# Patient Record
Sex: Male | Born: 1975 | Race: Black or African American | Hispanic: No | Marital: Married | Smoking: Never smoker
Health system: Southern US, Community
[De-identification: ages and names within clinical notes are randomized; demographics above are authoritative.]

## PROBLEM LIST (undated history)

## (undated) DIAGNOSIS — E785 Hyperlipidemia, unspecified: Secondary | ICD-10-CM

## (undated) DIAGNOSIS — I10 Essential (primary) hypertension: Secondary | ICD-10-CM

## (undated) DIAGNOSIS — T7840XA Allergy, unspecified, initial encounter: Secondary | ICD-10-CM

## (undated) DIAGNOSIS — K635 Polyp of colon: Secondary | ICD-10-CM

## (undated) DIAGNOSIS — K5792 Diverticulitis of intestine, part unspecified, without perforation or abscess without bleeding: Secondary | ICD-10-CM

## (undated) HISTORY — PX: VASECTOMY: SHX75

## (undated) HISTORY — DX: Diverticulitis of intestine, part unspecified, without perforation or abscess without bleeding: K57.92

## (undated) HISTORY — DX: Hyperlipidemia, unspecified: E78.5

## (undated) HISTORY — PX: FINGER SURGERY: SHX640

## (undated) HISTORY — PX: COLONOSCOPY W/ BIOPSIES: SHX1374

## (undated) HISTORY — DX: Essential (primary) hypertension: I10

## (undated) HISTORY — DX: Allergy, unspecified, initial encounter: T78.40XA

## (undated) HISTORY — DX: Polyp of colon: K63.5

---

## 2002-06-07 ENCOUNTER — Emergency Department (HOSPITAL_COMMUNITY): Admission: EM | Admit: 2002-06-07 | Discharge: 2002-06-07 | Payer: Self-pay | Admitting: Emergency Medicine

## 2002-06-07 ENCOUNTER — Encounter: Payer: Self-pay | Admitting: Emergency Medicine

## 2005-10-27 ENCOUNTER — Inpatient Hospital Stay (HOSPITAL_COMMUNITY): Admission: EM | Admit: 2005-10-27 | Discharge: 2005-10-31 | Payer: Self-pay | Admitting: Emergency Medicine

## 2005-12-21 ENCOUNTER — Encounter: Admission: RE | Admit: 2005-12-21 | Discharge: 2005-12-21 | Payer: Self-pay | Admitting: General Surgery

## 2005-12-25 ENCOUNTER — Ambulatory Visit: Payer: Self-pay | Admitting: Internal Medicine

## 2006-01-05 ENCOUNTER — Ambulatory Visit: Payer: Self-pay | Admitting: Internal Medicine

## 2006-10-18 DIAGNOSIS — K5792 Diverticulitis of intestine, part unspecified, without perforation or abscess without bleeding: Secondary | ICD-10-CM

## 2006-10-18 HISTORY — DX: Diverticulitis of intestine, part unspecified, without perforation or abscess without bleeding: K57.92

## 2010-06-27 ENCOUNTER — Inpatient Hospital Stay (INDEPENDENT_AMBULATORY_CARE_PROVIDER_SITE_OTHER)
Admission: RE | Admit: 2010-06-27 | Discharge: 2010-06-27 | Disposition: A | Discharge status: Home / Self Care | Payer: BC Managed Care – PPO | Source: Ambulatory Visit | Attending: Family Medicine | Admitting: Family Medicine

## 2010-06-27 ENCOUNTER — Emergency Department (HOSPITAL_COMMUNITY)
Admission: EM | Admit: 2010-06-27 | Discharge: 2010-06-27 | Disposition: A | Discharge status: Home / Self Care | Payer: BC Managed Care – PPO | Attending: Emergency Medicine | Admitting: Emergency Medicine

## 2010-06-27 ENCOUNTER — Emergency Department (HOSPITAL_COMMUNITY): Payer: BC Managed Care – PPO

## 2010-06-27 DIAGNOSIS — R000 Tachycardia, unspecified: Secondary | ICD-10-CM

## 2010-06-27 DIAGNOSIS — R079 Chest pain, unspecified: Secondary | ICD-10-CM

## 2010-06-27 DIAGNOSIS — J45909 Unspecified asthma, uncomplicated: Secondary | ICD-10-CM | POA: Insufficient documentation

## 2010-06-27 DIAGNOSIS — R002 Palpitations: Secondary | ICD-10-CM | POA: Insufficient documentation

## 2010-06-27 LAB — POCT CARDIAC MARKERS
CKMB, poc: 1.1 ng/mL (ref 1.0–8.0)
Myoglobin, poc: 88 ng/mL (ref 12–200)

## 2010-06-27 LAB — CBC
HCT: 46.4 % (ref 39.0–52.0)
Hemoglobin: 16.1 g/dL (ref 13.0–17.0)
MCHC: 34.7 g/dL (ref 30.0–36.0)
RBC: 5.23 MIL/uL (ref 4.22–5.81)

## 2010-06-27 LAB — BASIC METABOLIC PANEL
BUN: 13 mg/dL (ref 6–23)
Calcium: 10.6 mg/dL — ABNORMAL HIGH (ref 8.4–10.5)
GFR calc non Af Amer: >60 mL/min (ref >60)
Glucose, Bld: 94 mg/dL (ref 70–99)
Sodium: 138 mEq/L (ref 135–145)

## 2010-06-27 LAB — DIFFERENTIAL
Basophils Absolute: 0 10*3/uL (ref 0.0–0.1)
Lymphocytes Relative: 31 % (ref 12–46)
Monocytes Absolute: 0.4 10*3/uL (ref 0.1–1.0)
Monocytes Relative: 7 % (ref 3–12)
Neutro Abs: 3.8 10*3/uL (ref 1.7–7.7)

## 2010-06-30 ENCOUNTER — Ambulatory Visit (INDEPENDENT_AMBULATORY_CARE_PROVIDER_SITE_OTHER): Payer: BC Managed Care – PPO | Admitting: Medical

## 2010-06-30 ENCOUNTER — Encounter: Payer: Self-pay | Admitting: Medical

## 2010-06-30 VITALS — BP 171/109 | HR 110 | Ht 68.0 in | Wt 188.0 lb

## 2010-06-30 DIAGNOSIS — E785 Hyperlipidemia, unspecified: Secondary | ICD-10-CM

## 2010-06-30 DIAGNOSIS — I10 Essential (primary) hypertension: Secondary | ICD-10-CM

## 2010-06-30 DIAGNOSIS — R002 Palpitations: Secondary | ICD-10-CM

## 2010-06-30 MED ORDER — ATENOLOL 25 MG PO TABS: 25.0000 mg | ORAL_TABLET | Freq: Every day | ORAL | Status: DC

## 2010-06-30 NOTE — Patient Instructions (Signed)

## 2010-06-30 NOTE — Progress Notes (Signed)
Subjective:   HPI Derrick Garcia is a new patient here today, here for follow up from emergency department visit on 06/27/2010 for complaint of palpitations and chest pain after receiving some bad news. He initially showed up in the urgent care and was sent to the emergency department. EKG, labs, cardiac enzymes were done. He was advised to follow up with a primary care provider for chest pain of unknown etiology. Since his emergency department visit, he has cut down alcohol intake, and realizes the need to get his health in better shape including exercise and better diet. He notes a history of high cholesterol 2 years ago but no prior medication. He had high blood pressure a few years ago but was trying lifestyle changes to get under control instead of medication. No other new complaint.  Past Medical History  Diagnosis Date  . Asthma   . Diverticulitis September 2008    Hospitalization  . Hyperlipidemia   . Hypertension   . Allergy     Review of Systems Constitutional: denies fever, chills, sweats, unexpected weight change, anorexia, fatigue Cardiology: Positive for palpitations, intermittent several times per month, occasional mild chest pain that is brief; denies edema, orthopnea, paroxysmal nocturnal dyspnea Respiratory: denies cough, shortness of breath, dyspnea on exertion, wheezing, hemoptysis Gastroenterology: denies abdominal pain, nausea, vomiting, diarrhea, constipation Musculoskeletal: denies arthralgias, myalgias, back pain Ophthalmology: denies vision changes Urology: denies dysuria, difficulty urinating, hematuria, urinary frequency Neurology: no headache, weakness, tingling, numbness, speech abnormality, memory loss, falls, dizziness Psychology: denies depressed mood, agitation, sleep problems   Objective:   Filed Vitals:   06/30/10 1352  BP: 171/109  Pulse: 110    General appearance: alert, no distress, WD/WN, African American male , looks stated age HEENT:  normocephalic, conjunctiva/corneas normal, sclerae anicteric, PERRLA, EOMi, nares patent Oral cavity: MMM, tongue normal, teeth normal Neck: supple, no lymphadenopathy, no thyromegaly, no masses, no bruit  Heart: RRR, normal S1, S2, no murmurs Lungs: CTA bilaterally, no wheezes, rhonchi, or rales Abdomen: +bs, soft, non tender, non distended, no masses, no hepatomegaly, no splenomegaly, no bruits Extremities: no edema, no cyanosis, no clubbing Pulses: 2+ symmetric, upper and lower extremities, normal cap refill Neurological: alert, oriented x 3, CN2-12 intact, strength normal, sensation normal throughout, DTRs 2+ throughout, no cerebellar signs, gait normal Psychiatric: normal affect, behavior normal, pleasant      Assessment & Plan:    Encounter Diagnoses  Name Primary?  . Hypertension Yes  . Palpitations   . Hyperlipidemia     I reviewed his emergency department note from 06/27/10, his cardiac markers are normal, blood count normal, basic metabolic panel, chest x-ray was normal per report, EKG notation was normal although I do not have a copy of the EKG. His blood pressure was listed as 167/100 in the emergency department. We discussed the risk of uncontrolled hypertension, side effects of medication. We will start him on atenolol 25 mg daily for both palpitations and hypertension, and advised he'll probably be on medication lifelong. We'll refer him to cardiology for further evaluation with possible Holter monitor given the recommendation by the emergency department. He'll return this week for fasting labs. Advised he limit his alcohol intake, begin walking gradually increase his exercise but no weight lifting for now. Discussed healthier diet. Recheck in 1 month

## 2010-07-01 ENCOUNTER — Encounter: Payer: Self-pay | Admitting: Medical

## 2010-07-01 ENCOUNTER — Other Ambulatory Visit: Payer: BC Managed Care – PPO

## 2010-07-01 LAB — TSH: TSH: 1.859 u[IU]/mL (ref 0.350–4.500)

## 2010-07-01 LAB — ALT: ALT: 26 U/L (ref 0–53)

## 2010-07-02 ENCOUNTER — Telehealth: Payer: Self-pay | Admitting: *Deleted

## 2010-07-02 NOTE — Telephone Encounter (Addendum)
Message copied by Ellsworth Lennox on Wed Jul 02, 2010  8:37 AM ------      Message from: Aleen Campi, DAVID      Created: Wed Jul 02, 2010  8:28 AM       His thyroid lab came back normal, liver test normal, but his lipids are too high.  His fats/triglycerides were 394, and they should be under 150.   His total cholesterol 233.  I suspect a lot of this is diet related.  Thus, as we discussed, he needs to work on improving his diet, aerobic exercise such as walking, and eat a low fat, low cholesterol diet (less red meat, less cheese, less ice cream, less fast/fatty foods).  Lets plan to recheck his cholesterol and fats again in 59mo. Hopefully he has begun the Atenolol BP med, and c/t with plan to see cardiology per our referral.   Called to inform patient of lab results.  Patient has started BP meds.  Patient stated " I do eat a lot of red meat."  Advised patient to decrease red meat in diet because of triglycerides and cholesterol being high.  Patient agreed.  Patient scheduled an appointment on October 02, 2010 at 8:30 for a follow up on BP med and cholesterol/triglycerides.  CM, LPN

## 2010-09-02 ENCOUNTER — Telehealth: Payer: Self-pay | Admitting: *Deleted

## 2010-09-02 NOTE — Telephone Encounter (Signed)
pls pull his chart 

## 2010-09-09 ENCOUNTER — Other Ambulatory Visit: Payer: Self-pay | Admitting: Medical

## 2010-09-09 DIAGNOSIS — I10 Essential (primary) hypertension: Secondary | ICD-10-CM

## 2010-09-09 MED ORDER — ATENOLOL 25 MG PO TABS: 25.0000 mg | ORAL_TABLET | Freq: Every day | ORAL | Status: DC

## 2010-09-29 ENCOUNTER — Ambulatory Visit: Payer: BC Managed Care – PPO | Admitting: Medical

## 2010-09-30 ENCOUNTER — Ambulatory Visit (INDEPENDENT_AMBULATORY_CARE_PROVIDER_SITE_OTHER): Payer: BC Managed Care – PPO | Admitting: Medical

## 2010-09-30 ENCOUNTER — Encounter: Payer: Self-pay | Admitting: Medical

## 2010-09-30 VITALS — BP 130/90 | HR 80 | Temp 98.2°F | Resp 16 | Ht 68.0 in | Wt 179.0 lb

## 2010-09-30 DIAGNOSIS — I10 Essential (primary) hypertension: Secondary | ICD-10-CM

## 2010-09-30 DIAGNOSIS — E781 Pure hyperglyceridemia: Secondary | ICD-10-CM

## 2010-09-30 NOTE — Progress Notes (Signed)
Subjective:   HPI Derrick Garcia is a 35 y.o. male who presents for recheck.  I saw him as a new patient back in May for blood pressure concerns. He had just had an emergency dept visit for chest pain preceding his visit here.  I have referred him to cardiology for further evaluation.  At that time we had done some lab work showing elevated triglycerides and lipids.  He had also been drinking alcohol daily.  However he has made some big efforts to get his health in shape.  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 He has lost about 10 pounds since his last visit here. His blood pressure is under much better control. When he saw cardiology, he apparently had a normal sleep study, and a normal echocardiogram. He sees cardiology in 3 months for follow up. He also apparently just had a new baby boy born this week. So he now has 3 boys. No other c/o.  The following portions of the patient's history were reviewed and updated as appropriate: allergies, current medications, past family history, past medical history, past social history, past surgical history and problem list.  Past Medical History  Diagnosis Date  . Asthma   . Diverticulitis September 2008    Hospitalization  . Hyperlipidemia   . Hypertension   . Allergy     Review of Systems Gen: No fevers, chills, sweats Heart: No chest pain, palpitations, edema Lungs: No shortness of breath, cough Neuro: No headache, numbness, tingling Abdomen: No abdominal pain, nausea, vomiting, diarrhea    Objective:   Physical Exam  General appearance: alert, no distress, WD/WN, black male Heart: RRR, no murmur, normal S1, S2 Ext: no edema Pulses: 2+    Assessment :    Encounter Diagnoses  Name Primary?  . Essential hypertension, benign Yes  . Hypertriglyceridemia      Plan:     HTN - I reviewed his cardiology notes from July. His  blood pressure medications were increased at that time. His blood pressure is much better than it was when I first met him in May. His echocardiogram was normal, sleep study normal.  He will followup with cardiology in 3 months as planned  Hypertriglyceridemia- although his labs were elevated recently, he has since cut out a lot of alcohol,is exercising, has lost some weight. He has taken steps to improve his health care. We will hold off on labs today. he'll continue with lifestyle changes, diet and exercise goals, and we will see him back in 4-6 months.

## 2010-10-02 ENCOUNTER — Ambulatory Visit: Payer: BC Managed Care – PPO | Admitting: Medical

## 2011-01-18 ENCOUNTER — Encounter (HOSPITAL_COMMUNITY): Payer: Self-pay

## 2011-01-18 ENCOUNTER — Emergency Department (INDEPENDENT_AMBULATORY_CARE_PROVIDER_SITE_OTHER)
Admission: EM | Admit: 2011-01-18 | Discharge: 2011-01-18 | Disposition: A | Discharge status: Home / Self Care | Payer: BC Managed Care – PPO | Source: Home / Self Care | Attending: Emergency Medicine | Admitting: Emergency Medicine

## 2011-01-18 DIAGNOSIS — R6889 Other general symptoms and signs: Secondary | ICD-10-CM

## 2011-01-18 MED ORDER — IBUPROFEN 600 MG PO TABS: 600.0000 mg | ORAL_TABLET | Freq: Four times a day (QID) | ORAL | Status: AC | PRN

## 2011-01-18 MED ORDER — ALBUTEROL SULFATE HFA 108 (90 BASE) MCG/ACT IN AERS: 1.0000 | INHALATION_SPRAY | Freq: Four times a day (QID) | RESPIRATORY_TRACT | Status: DC | PRN

## 2011-01-18 MED ORDER — HYDROCODONE-HOMATROPINE 5-1.5 MG/5ML PO SYRP: 5.0000 mL | ORAL_SOLUTION | Freq: Four times a day (QID) | ORAL | Status: AC | PRN

## 2011-01-18 NOTE — ED Provider Notes (Signed)
History     CSN: [id number] Arrival date & time: 01/18/2011  9:12 AM   First MD Initiated Contact with Patient 01/18/11 [phone]      Chief Complaint  Patient presents with  . Influenza    Pt has cough, fever and bodyaches since yesterday    HPI Comments: Pt with rhinorrhea, postnasal drip, ST, nonproductive cough, fatigue. bodyaches starting yesterday. Fevers tmax 100.3.  No ear pain, abd pain, wheeze, SOB, abd pain, rash, N/V. Slightly decreased appetite but is tolerating po. Unable to sleep at night secondary to coughing.       Patient is a 35 y.o. male presenting with flu symptoms. The history is provided by the patient.  Influenza This is a new problem. The current episode started yesterday. Pertinent negatives include no chest pain, no abdominal pain, no headaches and no shortness of breath. The symptoms are aggravated by nothing. He has tried nothing for the symptoms.    Past Medical History  Diagnosis Date  . Asthma   . Diverticulitis September 2008    Hospitalization  . Hyperlipidemia   . Hypertension   . Allergy     History reviewed. No pertinent past surgical history.  Family History  Problem Relation Age of Onset  . Hypertension Mother   . Hypertension Father   . Heart disease Brother     Data as a child from a hole in the heart    History  Substance Use Topics  . Smoking status: Never Smoker   . Smokeless tobacco: Never Used  . Alcohol Use: 13.2 oz/week    22 Cans of beer per week      Review of Systems  Constitutional: Positive for fever, chills and fatigue.  HENT: Positive for congestion and sore throat. Negative for ear pain, trouble swallowing and voice change.   Respiratory: Positive for cough. Negative for chest tightness, shortness of breath and wheezing.   Cardiovascular: Negative for chest pain.  Gastrointestinal: Negative for nausea, vomiting, abdominal pain and diarrhea.  Skin: Negative for rash.  Neurological: Negative for headaches.      Allergies  Ciprocin-fluocin-procin and Penicillins  Home Medications   Current Outpatient Rx  Name Route Sig Dispense Refill  . ALBUTEROL SULFATE HFA 108 (90 BASE) MCG/ACT IN AERS Inhalation Inhale 1-2 puffs into the lungs every 6 (six) hours as needed for wheezing. 1 Inhaler 0  . ATENOLOL 25 MG PO TABS Oral Take 1 tablet (25 mg total) by mouth daily. 90 tablet 2  . HYDROCODONE-HOMATROPINE 5-1.5 MG/5ML PO SYRP Oral Take 5 mLs by mouth every 6 (six) hours as needed for cough or pain. 120 mL 0  . IBUPROFEN 600 MG PO TABS Oral Take 1 tablet (600 mg total) by mouth every 6 (six) hours as needed for pain. 30 tablet 0  . LOSARTAN POTASSIUM 100 MG PO TABS Oral Take 100 mg by mouth daily.        BP 147/89  Pulse 93  Temp(Src) 100.3 F (37.9 C) (Oral)  Resp 16  SpO2 99%  Physical Exam  Nursing note and vitals reviewed. Constitutional: He is oriented to person, place, and time. He appears well-developed and well-nourished.  HENT:  Head: Normocephalic and atraumatic.  Right Ear: Hearing, tympanic membrane and ear canal normal.  Left Ear: Hearing, tympanic membrane and ear canal normal.  Nose: Mucosal edema and rhinorrhea present. No epistaxis.  Mouth/Throat: Uvula is midline and mucous membranes are normal. Posterior oropharyngeal erythema present. No oropharyngeal exudate.  No sinus tenderness  Eyes: Conjunctivae and EOM are normal. Pupils are equal, round, and reactive to light.  Neck: Normal range of motion. Neck supple.  Cardiovascular: Normal rate, regular rhythm and normal heart sounds.   Pulmonary/Chest: Effort normal and breath sounds normal. No respiratory distress.       occ scattered wheezing  Abdominal: Soft. Bowel sounds are normal. He exhibits no distension. There is no tenderness.  Musculoskeletal: Normal range of motion. He exhibits no edema and no tenderness.  Lymphadenopathy:    He has no cervical adenopathy.  Neurological: He is alert and oriented to person,  place, and time.  Skin: Skin is warm and dry. No rash noted.  Psychiatric: He has a normal mood and affect. His behavior is normal. Judgment and thought content normal.    ED Course  Procedures (including critical care time)  Labs Reviewed - No data to display No results found.   1. Flu-like symptoms       MDM  Pt appears to be in NAD. VSS. Pt non-toxic appearing. No evidence of pharyngitis or OM. No evidence of neck stiffness or other sx to support meningitis. No evidence of dehydration. Abd S/NT/ND without peritoneal sx. Doubt intraabdominal process. No evidence of PNA. Pt able to tolerate PO. Pt with viral syndrome, probable flu. Has h/o asthma, no albuterol at home will have him start this prn cough as well. Will treat symptomatically and have pt f/u with PCP PRN   Luiz Blare, MD 01/18/11 [phone]

## 2011-04-25 ENCOUNTER — Encounter (HOSPITAL_COMMUNITY): Payer: Self-pay | Admitting: Emergency Medicine

## 2011-04-25 ENCOUNTER — Emergency Department (INDEPENDENT_AMBULATORY_CARE_PROVIDER_SITE_OTHER)
Admission: EM | Admit: 2011-04-25 | Discharge: 2011-04-25 | Disposition: A | Discharge status: Home / Self Care | Payer: BC Managed Care – PPO | Source: Home / Self Care | Attending: Emergency Medicine | Admitting: Emergency Medicine

## 2011-04-25 ENCOUNTER — Emergency Department (INDEPENDENT_AMBULATORY_CARE_PROVIDER_SITE_OTHER): Payer: BC Managed Care – PPO

## 2011-04-25 DIAGNOSIS — S62619A Displaced fracture of proximal phalanx of unspecified finger, initial encounter for closed fracture: Secondary | ICD-10-CM

## 2011-04-25 DIAGNOSIS — IMO0002 Reserved for concepts with insufficient information to code with codable children: Secondary | ICD-10-CM

## 2011-04-25 MED ORDER — OXYCODONE-ACETAMINOPHEN 5-325 MG PO TABS: ORAL_TABLET | ORAL | Status: AC

## 2011-04-25 NOTE — ED Provider Notes (Signed)
Chief Complaint  Patient presents with  . Finger Injury    History of Present Illness:   [name] is a 36 year old male who injured his right little finger playing football with his son today. The finger is some deformity. It's painful, and he cannot flex or extend. No numbness or tingling.  Review of Systems:  Other than noted above, the patient denies any of the following symptoms: Systemic:  No fevers, chills, sweats, or aches.  No fatigue or tiredness. Musculoskeletal:  No joint pain, arthritis, bursitis, swelling, back pain, or neck pain. Neurological:  No muscular weakness, paresthesias, headache, or trouble with speech or coordination.  No dizziness.   PMFSH:  Past medical history, family history, social history, meds, and allergies were reviewed.  Physical Exam:   Vital signs:  BP 132/86  Pulse 83  Temp(Src) 99.8 F (37.7 C) (Oral)  Resp 18  SpO2 100% Gen:  Alert and oriented times 3.  In no distress. Musculoskeletal: The little finger shows formally with medial angulation, tenderness to palpation, swelling, and inability to move the MCP joint. Otherwise, all joints had a full a ROM with no swelling, bruising or deformity.  No edema, pulses full. Extremities were warm and pink.  Capillary refill was brisk.  Skin:  Clear, warm and dry.  No rash. Neuro:  Alert and oriented times 3.  Muscle strength was normal.  Sensation was intact to light touch.   Radiology:  Dg Hand Complete Right  04/25/2011  *RADIOLOGY REPORT*  Clinical Data: Injured little finger today while playing football.  RIGHT HAND - COMPLETE 3+ VIEW  Comparison: No priors.  Findings: There is a comminuted fracture of the proximal phalanx of the right fifth finger, which appears angulated on the lateral and oblique projections (apex volar with approximately 20 degrees of angulation).  The fracture lines do not appear to extend to the articular surface of either the fifth metacarpal phalangeal joint, or the fifth  proximal interphalangeal joint.  The remainder of the bones of the right hand otherwise appear intact.  IMPRESSION: 1.  Comminuted and mildly angulated fracture of the proximal phalanx of the right fifth finger, as above.  Original Report Authenticated By: Florencia Reasons, M.D.   Medical Decision Making:  I called the hand surgeon on call, Dr. Bradly Bienenstock, he recommended immobilization in an ulnar gutter splint and he will contact the patient tomorrow to schedule surgery.  Course in Urgent Care Center:   An ulnar gutter splint was applied by the orthopedic technician.  Assessment:   Diagnoses that have been ruled out:  None  Diagnoses that are still under consideration:  None  Final diagnoses:  Fracture of proximal phalanx of finger    Plan:   1.  The following meds were prescribed:   New Prescriptions   OXYCODONE-ACETAMINOPHEN (PERCOCET) 5-325 MG PER TABLET    1 to 2 tablets every 6 hours as needed for pain.   2.  The patient was instructed in symptomatic care, including rest and activity, elevation, application of ice and compression.  Appropriate handouts were given. 3.  The patient was told to return if becoming worse in any way, if no better in 3 or 4 days, and given some red flag symptoms that would indicate earlier return.   4.  The patient was told to follow up with Dr. Bradly Bienenstock. He'll be calling to schedule the surgery.   Reuben Likes, MD 04/25/11 [phone]

## 2011-04-25 NOTE — Progress Notes (Signed)
Orthopedic Tech Progress Note Patient Details:  Derrick Garcia 08-Jul-1975 [id number]  Other Ortho Devices Type of Ortho Device: Ace wrap Ortho Device Location: (R) UE Ortho Device Interventions: Application  Type of Splint: Other (comment) (ulna gutter) Splint Location: (R) UE Splint Interventions: Application    Jennye Moccasin 04/25/2011, 6:43 PM

## 2011-04-25 NOTE — ED Notes (Signed)
Pt. Complains of right little finger pain stating "I think its broken". Was out playing football and finger was jammed while trying to catch the ball.

## 2011-04-25 NOTE — Discharge Instructions (Signed)
Cast or Splint Care Casts and splints support injured limbs and keep bones from moving while they heal.  HOME CARE  Keep the cast or splint uncovered during the drying period.   A plaster cast can take 24 to 48 hours to dry.   A fiberglass cast will dry in less than 1 hour.   Do not rest the cast on anything harder than a pillow for 24 hours.   Do not put weight on your injured limb. Do not put pressure on the cast. Wait for your doctor's approval.   Keep the cast or splint dry.   Cover the cast or splint with a plastic bag during baths or wet weather.   If you have a cast over your chest and belly (trunk), take sponge baths until the cast is taken off.   Keep your cast or splint clean. Wash a dirty cast with a damp cloth.   Do not put any objects under your cast or splint. Do not scratch the skin under the cast with an object.   Do not take out the padding from inside your cast.   Exercise your joints near the cast as told by your doctor.   Raise (elevate) your injured limb on 1 or 2 pillows for the first 1 to 3 days.  GET HELP RIGHT AWAY IF:  Your cast or splint cracks.   Your cast or splint is too tight or too loose.   You itch badly under the cast.   Your cast gets wet or has a soft spot.   You have a bad smell coming from the cast.   You get an object stuck under the cast.   Your skin around the cast becomes red or raw.   You have new or more pain after the cast is put on.   You have fluid leaking through the cast.   You cannot move your fingers or toes.   Your fingers or toes turn colors or are cool, painful, or puffy (swollen).   You have tingling or lose feeling (numbness) around the injured area.   You have pain or pressure under the cast.   You have trouble breathing or have shortness of breath.   You have chest pain.  MAKE SURE YOU:  Understand these instructions.   Will watch your condition.   Will get help right away if you are not doing  well or get worse.  Document Released: 06/04/2010 Document Revised: 01/22/2011 Document Reviewed: 06/04/2010 [employer] Patient Information 2012 [employer].Finger Fracture Fractures of fingers are breaks in the bones of the fingers. There are many types of fractures. There are different ways of treating these fractures, all of which can be correct. Your caregiver will discuss the best way to treat your fracture. TREATMENT  Finger fractures can be treated with:   Non-reduction - this means the bones are in place. The finger is splinted without changing the positions of the bone pieces. The splint is usually left on for about a week to ten days. This will depend on your fracture and what your caregiver thinks.   Closed reduction - the bones are put back into position without using surgery. The finger is then splinted.   ORIF (open reduction and internal fixation) - the fracture site is opened. Then the bone pieces are fixed into place with pins or some type of hardware. This is seldom required. It depends on the severity of the fracture.  Your caregiver will discuss the type of   fracture you have and the treatment that will be best for that problem. If surgery is the treatment of choice, the following is information for you to know and also let your caregiver know about prior to surgery. LET YOUR CAREGIVER KNOW ABOUT:  Allergies   Medications taken including herbs, eye drops, over the counter medications, and creams   Use of steroids (by mouth or creams)   Previous problems with anesthetics or Novocaine   Possibility of pregnancy, if this applies   History of blood clots (thrombophlebitis)   History of bleeding or blood problems   Previous surgery   Other health problems  AFTER THE PROCEDURE After surgery, you will be taken to the recovery area where a nurse will check your progress. Once you're awake, stable, and taking fluids well, barring other problems you will be allowed to go  home. Once home an ice pack applied to your operative site may help with discomfort and keep the swelling down. HOME CARE INSTRUCTIONS   Follow your caregiver's instructions as to activities, exercises, physical therapy, and driving a car.   Use your finger and exercise as directed.   Only take over-the-counter or prescription medicines for pain, discomfort, or fever as directed by your caregiver. Do not take aspirin until your caregiver OK's it, as this can increase bleeding immediately following surgery.   Stop using ibuprofen if it upsets your stomach. Let your caregiver know about it.  SEEK MEDICAL CARE IF:  You have increased bleeding (more than a small spot) from the wound or from beneath your splint.   You develop redness, swelling, or increasing pain in the wound or from beneath your splint.   There is pus coming from the wound or from beneath your splint.   An unexplained oral temperature above 102 F (38.9 C) develops, or as your caregiver suggests.   There is a foul smell coming from the wound or dressing or from beneath your splint.  SEEK IMMEDIATE MEDICAL CARE IF:   You develop a rash.   You have difficulty breathing.   You have any allergic problems.  MAKE SURE YOU:   Understand these instructions.   Will watch your condition.   Will get help right away if you are not doing well or get worse.  Document Released: 05/17/2000 Document Revised: 01/22/2011 Document Reviewed: 09/22/2007 [employer] Patient Information 2012 [employer]. 

## 2011-10-28 ENCOUNTER — Telehealth: Payer: Self-pay | Admitting: Internal Medicine

## 2011-10-28 DIAGNOSIS — I10 Essential (primary) hypertension: Secondary | ICD-10-CM

## 2011-10-28 MED ORDER — ATENOLOL 25 MG PO TABS: 25.0000 mg | ORAL_TABLET | Freq: Every day | ORAL | Status: DC

## 2011-10-28 NOTE — Telephone Encounter (Signed)
Patients RX for BP medication was sent to the pharmacy. CLS

## 2011-10-30 ENCOUNTER — Telehealth: Payer: Self-pay | Admitting: Internal Medicine

## 2011-10-30 DIAGNOSIS — I10 Essential (primary) hypertension: Secondary | ICD-10-CM

## 2011-10-30 MED ORDER — ATENOLOL 25 MG PO TABS: 25.0000 mg | ORAL_TABLET | Freq: Every day | ORAL | Status: DC

## 2011-10-30 NOTE — Telephone Encounter (Signed)
Atenolol 25mg  was called in to pharmacy

## 2012-01-11 ENCOUNTER — Other Ambulatory Visit: Payer: Self-pay | Admitting: Medical

## 2012-01-11 ENCOUNTER — Encounter: Payer: Self-pay | Admitting: Family Medicine

## 2012-01-11 ENCOUNTER — Ambulatory Visit (INDEPENDENT_AMBULATORY_CARE_PROVIDER_SITE_OTHER): Payer: BC Managed Care – PPO | Admitting: Family Medicine

## 2012-01-11 VITALS — BP 124/88 | HR 74 | Wt 175.0 lb

## 2012-01-11 DIAGNOSIS — K5792 Diverticulitis of intestine, part unspecified, without perforation or abscess without bleeding: Secondary | ICD-10-CM

## 2012-01-11 DIAGNOSIS — I10 Essential (primary) hypertension: Secondary | ICD-10-CM

## 2012-01-11 DIAGNOSIS — K5732 Diverticulitis of large intestine without perforation or abscess without bleeding: Secondary | ICD-10-CM

## 2012-01-11 MED ORDER — ATENOLOL 25 MG PO TABS: 25.0000 mg | ORAL_TABLET | Freq: Every day | ORAL | Status: DC

## 2012-01-11 MED ORDER — SULFAMETHOXAZOLE-TRIMETHOPRIM 800-160 MG PO TABS: 1.0000 | ORAL_TABLET | Freq: Two times a day (BID) | ORAL | Status: DC

## 2012-01-11 MED ORDER — METRONIDAZOLE 500 MG PO TABS: 500.0000 mg | ORAL_TABLET | Freq: Two times a day (BID) | ORAL | Status: DC

## 2012-01-11 NOTE — Patient Instructions (Addendum)
You can eat anything that you want but no alcohol

## 2012-01-11 NOTE — Telephone Encounter (Signed)
RX REFILL FOR 30 DAYS WAS SENT PATIENT NEEDS A MEDICATION CHECK FOR HIS BLOOD PRESSURE MEDICATIONS. CLS

## 2012-01-11 NOTE — Progress Notes (Signed)
  Subjective:    Patient ID: Derrick Garcia, male    DOB: [birth year], 36 y.o.   MRN: [id number]  HPI He is here for evaluation of left lower abdominal pain. This started about 5 days ago. He's had no nausea, vomiting and only mild diarrhea initially. No fever or chills. He does have a previous history of difficulty with this. The medical record was reviewed and showed in 2007 he was treated for diverticulitis. He did get a barium enema which did show evidence of diverticular disease. He is here to try Cyst early as he was in a lot of pain and did need to be admitted for several days.  Review of Systems     Objective:   Physical Exam Alert and in no distress. Cardiac exam shows regular rhythm without murmurs gallops. Lungs are clear to auscultation. Abdominal exam showed active bowel sounds with tenderness in the left lower quadrant but no rebound or referred pain       Assessment & Plan:   1. Diverticulitis  sulfamethoxazole-trimethoprim (BACTRIM DS,SEPTRA DS) 800-160 MG per tablet, metroNIDAZOLE (FLAGYL) 500 MG tablet   warned him against drinking alcohol. Let him eat and he wants. He will call if further difficulty the

## 2012-05-23 ENCOUNTER — Encounter: Payer: Self-pay | Admitting: Family Medicine

## 2012-05-23 ENCOUNTER — Telehealth: Payer: Self-pay | Admitting: Family Medicine

## 2012-05-23 ENCOUNTER — Ambulatory Visit (INDEPENDENT_AMBULATORY_CARE_PROVIDER_SITE_OTHER): Payer: BC Managed Care – PPO | Admitting: Family Medicine

## 2012-05-23 VITALS — BP 120/80 | HR 78 | Wt 190.0 lb

## 2012-05-23 DIAGNOSIS — R1032 Left lower quadrant pain: Secondary | ICD-10-CM

## 2012-05-23 DIAGNOSIS — Z8719 Personal history of other diseases of the digestive system: Secondary | ICD-10-CM

## 2012-05-23 NOTE — Telephone Encounter (Signed)
PATIENT LAST SEEN 01/11/12 FOR THIS

## 2012-05-23 NOTE — Telephone Encounter (Signed)
Let him know that he needs to come in today to check on this

## 2012-05-23 NOTE — Telephone Encounter (Signed)
Pt called and states another flair up of Diverticultis.  Will you call him in another round of antibiotics to [facility] .  Pt# [phone]

## 2012-05-23 NOTE — Patient Instructions (Signed)
If the abdominal pain gets worse give me a call and I'll start on antibiotics.4 Advil 3 times per day or 2 Aleve twice per day for pain

## 2012-05-23 NOTE — Progress Notes (Signed)
  Subjective:    Patient ID: Derrick Garcia, male    DOB: 10-14-1975, 37 y.o.   MRN: [id number]  HPI He is here for evaluation of a one-day history of left lower quadrant pain. He states that today it is feeling slightly better. He has a previous history of difficulty with diverticulitis. He was admitted on one occasion for treatment of this. He is here early to hopefully prevent this from occurring. No fever, chills, nausea, vomiting or diarrhea.   Review of Systems     Objective:   Physical Exam Alert and in no distress. Abdominal exam shows decreased bowel sounds. No masses are noted he does have left lower quadrant tenderness with questionable rebound.       Assessment & Plan:  Abdominal pain, left lower quadrant  History of diverticulitis of colon since he is feeling slightly better, I will hold off on doing any antibiotics. Encouraged him to call me if his symptoms worsen. He is comfortable with that approach.

## 2012-11-18 ENCOUNTER — Other Ambulatory Visit (INDEPENDENT_AMBULATORY_CARE_PROVIDER_SITE_OTHER): Payer: BC Managed Care – PPO

## 2012-11-18 DIAGNOSIS — Z23 Encounter for immunization: Secondary | ICD-10-CM

## 2013-01-14 ENCOUNTER — Encounter (HOSPITAL_COMMUNITY): Payer: Self-pay | Admitting: Emergency Medicine

## 2013-01-14 ENCOUNTER — Emergency Department (HOSPITAL_COMMUNITY): Payer: BC Managed Care – PPO

## 2013-01-14 ENCOUNTER — Emergency Department (HOSPITAL_COMMUNITY)
Admission: EM | Admit: 2013-01-14 | Discharge: 2013-01-14 | Disposition: A | Discharge status: Home / Self Care | Payer: BC Managed Care – PPO | Attending: Emergency Medicine | Admitting: Emergency Medicine

## 2013-01-14 DIAGNOSIS — Z8719 Personal history of other diseases of the digestive system: Secondary | ICD-10-CM | POA: Insufficient documentation

## 2013-01-14 DIAGNOSIS — J45909 Unspecified asthma, uncomplicated: Secondary | ICD-10-CM | POA: Insufficient documentation

## 2013-01-14 DIAGNOSIS — M79609 Pain in unspecified limb: Secondary | ICD-10-CM | POA: Insufficient documentation

## 2013-01-14 DIAGNOSIS — R0789 Other chest pain: Secondary | ICD-10-CM | POA: Diagnosis present

## 2013-01-14 DIAGNOSIS — Z79899 Other long term (current) drug therapy: Secondary | ICD-10-CM | POA: Insufficient documentation

## 2013-01-14 DIAGNOSIS — M7989 Other specified soft tissue disorders: Secondary | ICD-10-CM | POA: Insufficient documentation

## 2013-01-14 DIAGNOSIS — I10 Essential (primary) hypertension: Secondary | ICD-10-CM | POA: Insufficient documentation

## 2013-01-14 DIAGNOSIS — R1013 Epigastric pain: Secondary | ICD-10-CM | POA: Diagnosis present

## 2013-01-14 DIAGNOSIS — Z880 Allergy status to penicillin: Secondary | ICD-10-CM | POA: Insufficient documentation

## 2013-01-14 DIAGNOSIS — E785 Hyperlipidemia, unspecified: Secondary | ICD-10-CM | POA: Insufficient documentation

## 2013-01-14 LAB — CBC WITH DIFFERENTIAL/PLATELET
Basophils Relative: 1 % (ref 0–1)
Hemoglobin: 15 g/dL (ref 13.0–17.0)
Lymphs Abs: 2.5 10*3/uL (ref 0.7–4.0)
Monocytes Relative: 9 % (ref 3–12)
Neutro Abs: 2.7 10*3/uL (ref 1.7–7.7)
Neutrophils Relative %: 46 % (ref 43–77)
RBC: 4.71 MIL/uL (ref 4.22–5.81)

## 2013-01-14 LAB — COMPREHENSIVE METABOLIC PANEL
ALT: 25 U/L (ref 0–53)
Albumin: 3.9 g/dL (ref 3.5–5.2)
Alkaline Phosphatase: 75 U/L (ref 39–117)
BUN: 15 mg/dL (ref 6–23)
Chloride: 100 mEq/L (ref 96–112)
Glucose, Bld: 110 mg/dL — ABNORMAL HIGH (ref 70–99)
Potassium: 4.4 mEq/L (ref 3.5–5.1)
Total Bilirubin: 0.3 mg/dL (ref 0.3–1.2)

## 2013-01-14 LAB — LIPASE, BLOOD: Lipase: 30 U/L (ref 11–59)

## 2013-01-14 MED ORDER — SODIUM CHLORIDE 0.9 % IV BOLUS (SEPSIS)
1000.0000 mL | INTRAVENOUS | Status: AC
  Administered 2013-01-14: 1000 mL via INTRAVENOUS

## 2013-01-14 MED ORDER — GI COCKTAIL ~~LOC~~
30.0000 mL | Freq: Once | ORAL | Status: AC
  Administered 2013-01-14: 30 mL via ORAL
  Filled 2013-01-14: qty 30

## 2013-01-14 NOTE — ED Provider Notes (Signed)
CSN: [id number]     Arrival date & time 01/14/13  0407 History   First MD Initiated Contact with Patient 01/14/13 0415     Chief Complaint  Patient presents with  . GI Problem  . Arm Pain  . Leg Swelling   (Consider location/radiation/quality/duration/timing/severity/associated sxs/prior Treatment) Patient is a 37 y.o. male presenting with GI illness and arm pain. The history is provided by the patient.  GI Problem This is a new problem. The current episode started 3 to 5 hours ago. Episode frequency: intermittent. The problem has been resolved. Associated symptoms include chest pain and abdominal pain. Pertinent negatives include no headaches and no shortness of breath. Nothing aggravates the symptoms. Nothing relieves the symptoms. He has tried nothing for the symptoms. The treatment provided significant relief.  Arm Pain Associated symptoms include chest pain and abdominal pain. Pertinent negatives include no headaches and no shortness of breath.    Past Medical History  Diagnosis Date  . Asthma   . Diverticulitis September 2008    Hospitalization  . Hyperlipidemia   . Hypertension   . Allergy    History reviewed. No pertinent past surgical history. Family History  Problem Relation Age of Onset  . Hypertension Mother   . Hypertension Father   . Heart disease Brother     Data as a child from a hole in the heart   History  Substance Use Topics  . Smoking status: Never Smoker   . Smokeless tobacco: Never Used  . Alcohol Use: 0.0 oz/week     Comment: socially    Review of Systems  Constitutional: Negative for fever.  HENT: Negative for drooling and rhinorrhea.   Eyes: Negative for pain.  Respiratory: Negative for cough and shortness of breath.   Cardiovascular: Positive for chest pain. Negative for leg swelling.  Gastrointestinal: Positive for abdominal pain. Negative for nausea, vomiting and diarrhea.  Genitourinary: Negative for dysuria and hematuria.   Musculoskeletal: Negative for gait problem and neck pain.  Skin: Negative for color change.  Neurological: Negative for numbness and headaches.  Hematological: Negative for adenopathy.  Psychiatric/Behavioral: Negative for behavioral problems.  All other systems reviewed and are negative.    Allergies  Ciprocin-fluocin-procin and Penicillins  Home Medications   Current Outpatient Rx  Name  Route  Sig  Dispense  Refill  . EXPIRED: albuterol (PROVENTIL HFA;VENTOLIN HFA) 108 (90 BASE) MCG/ACT inhaler   Inhalation   Inhale 1-2 puffs into the lungs every 6 (six) hours as needed for wheezing.   1 Inhaler   0   . EXPIRED: atenolol (TENORMIN) 25 MG tablet   Oral   Take 1 tablet (25 mg total) by mouth daily. Patient need to schedule an office visit to follow up on her Blood pressure before her medications runs out.   30 tablet   0   . losartan (COZAAR) 100 MG tablet   Oral   Take 100 mg by mouth daily.            BP 170/98  Temp(Src) 98.6 F (37 C) (Oral)  Resp 20  SpO2 98% Physical Exam  Nursing note and vitals reviewed. Constitutional: He is oriented to person, place, and time. He appears well-developed and well-nourished.  HENT:  Head: Normocephalic and atraumatic.  Right Ear: External ear normal.  Left Ear: External ear normal.  Nose: Nose normal.  Mouth/Throat: Oropharynx is clear and moist. No oropharyngeal exudate.  Eyes: Conjunctivae and EOM are normal. Pupils are equal, round, and reactive to  light.  Neck: Normal range of motion. Neck supple.  Cardiovascular: Normal rate, regular rhythm, normal heart sounds and intact distal pulses.  Exam reveals no gallop and no friction rub.   No murmur heard. Pulmonary/Chest: Effort normal and breath sounds normal. No respiratory distress. He has no wheezes.  Abdominal: Soft. Bowel sounds are normal. He exhibits no distension. There is no tenderness. There is no rebound and no guarding.  Musculoskeletal: Normal range of  motion.  Mild swelling and tenderness to palpation of the left lateral inferior aspect of the ankle.  2+ distal pulses.  Symmetric appearing lower extremities without focal tenderness to palpation of the thighs or calves.  Neurological: He is alert and oriented to person, place, and time.  Skin: Skin is warm and dry.  Psychiatric: He has a normal mood and affect. His behavior is normal.    ED Course  Procedures (including critical care time) Labs Review Labs Reviewed  COMPREHENSIVE METABOLIC PANEL - Abnormal; Notable for the following:    Glucose, Bld 110 (*)    GFR calc non Af Amer 75 (*)    GFR calc Af Amer 86 (*)    All other components within normal limits  CBC WITH DIFFERENTIAL  LIPASE, BLOOD  TROPONIN I   Imaging Review Dg Chest 2 View  01/14/2013   CLINICAL DATA:  Chest pain  EXAM: CHEST  2 VIEW  COMPARISON:  Prior radiograph from 06/27/2010  FINDINGS: The cardiac and mediastinal silhouettes are stable in size and contour, and remain within normal limits.  Lungs are normally inflated. No focal infiltrate, pleural effusion, or pulmonary edema. There is no pneumothorax.  Osseous structures are within normal limits.  IMPRESSION: No active cardiopulmonary disease.   Electronically Signed   By: Rise Mu M.D.   On: 01/14/2013 05:48    EKG Interpretation    Date/Time:  Saturday January 14 2013 04:10:36 EST Ventricular Rate:  112 PR Interval:  122 QRS Duration: 86 QT Interval:  328 QTC Calculation: 447 R Axis:   16 Text Interpretation:  Sinus tachycardia RSR' or QR pattern in V1 suggests right ventricular conduction delay non-specific t wave changes Confirmed by Kyliee Ortego  MD, Zaidyn Claire ([id number]) on 01/14/2013 5:00:40 AM            MDM   1. Epigastric pain   2. Atypical chest pain    4:56 AM 37 y.o. male with a history of hypertension who presents with burning sensation in his epigastrium and lower chest which began at approximately 1 AM this morning. He  states that he also felt left arm aching at that time. He notes that he also developed some mild left ankle pain several days ago which has persisted. He also has some very mild associated swelling of the left ankle. He does not remember injuring the ankle. He is afebrile and mildly tachycardic on my exam. He denies any symptoms on my exam. He denies any shortness of breath or palpitations. I suspect reflux as a cause of his symptoms and will give him a GI cocktail. Will get screening labs as well due to the nonspecific location of the pain.  6:39 AM: Pt remains asx. I interpreted/reviewed the labs and/or imaging which were non-contributory.  Pt is low risk for MACE per HEART score. I suspect his sx are d/t reflux after laying supine.  I have discussed the diagnosis/risks/treatment options with the patient and believe the pt to be eligible for discharge home to follow-up with pcp as needed. We  also discussed returning to the ED immediately if new or worsening sx occur. We discussed the sx which are most concerning (e.g., sob, fever, return of pain) that necessitate immediate return. Any new prescriptions provided to the patient are listed below.  New Prescriptions   No medications on file      Junius Argyle, MD 01/14/13 [phone]

## 2013-01-14 NOTE — ED Notes (Signed)
Patient here with complaint of GI type burning pain. Also says his left arm feels strange like soreness after exercise, and his left ankle feels sore.

## 2013-01-17 ENCOUNTER — Encounter: Payer: Self-pay | Admitting: Internal Medicine

## 2013-01-17 ENCOUNTER — Ambulatory Visit (INDEPENDENT_AMBULATORY_CARE_PROVIDER_SITE_OTHER): Payer: BC Managed Care – PPO | Admitting: Family Medicine

## 2013-01-17 ENCOUNTER — Encounter: Payer: Self-pay | Admitting: Family Medicine

## 2013-01-17 VITALS — BP 140/80 | HR 91 | Wt 190.0 lb

## 2013-01-17 DIAGNOSIS — I10 Essential (primary) hypertension: Secondary | ICD-10-CM

## 2013-01-17 MED ORDER — LOSARTAN POTASSIUM 100 MG PO TABS: 100.0000 mg | ORAL_TABLET | Freq: Every day | ORAL | Status: DC

## 2013-01-17 MED ORDER — ATENOLOL 25 MG PO TABS: 25.0000 mg | ORAL_TABLET | Freq: Every day | ORAL | Status: DC

## 2013-01-17 NOTE — Patient Instructions (Signed)
Take 2 Aleve twice a day for the next 7-10 days. If all your symptoms go way you are home free; if you get worse then we need to reevaluate

## 2013-01-17 NOTE — Progress Notes (Signed)
Teaching Physician: Sharlot Gowda, MD Dictated By: Judithann Graves  Subjective:  Derrick Garcia is a 37 y.o. male who presents for follow-up from recent ED visit for episode of presumed GERD. At 1 am Saturday morning Mr. Spragg woke up with burning substernal pain and he noticed a sensation of soreness along his left arm and painful left ankle swelling a couple of hours later. He was given a GI cocktail for a presumed episode of GERD and his burning substernal pain and soreness along his left arm subsided. Today he has no chest pain, abdominal pain, nausea, vomiting, diarrhea, melena, hematochezia.  His painful left ankle swelling has persisted however and today he notices swelling on his right ankle. There is no instance of trauma that he is able to recall, other joint involvement, fever, warmth, nor redness. Would also like to have his blood pressure medications renewed ROS as in subjective.  Objective: Filed Vitals:   01/17/13 1603  BP: 140/80  Pulse: 91    Physical Exam:  General: Alert and in no distress  HEENT: Tympanic membranes and canals are normal. Throat is clear. Tonsils are normal.  Neck: Supple without adenopathy or thyromegaly CV: Regular sinus rhythm without murmurs or gallops  Pulm: Lungs are clear to auscultation  Abd: Soft, NT with NABS MSK: Effusion present R, L ankle. Pain on eversion, inversion of Left ankle. FROM dorsiflexion, plantarflexion, inversion, eversion of foot at the ankle bilaterally. Knees, elbows, wrists show no swelling Ext: 2+ palpable pulse dorsalis pedis bilaterally  Assessment and Plan: 1. Hypertension Continue current regimen: - losartan (COZAAR) 100 MG tablet; Take 1 tablet (100 mg total) by mouth daily.  Dispense: 90 tablet; Refill: 3 - atenolol (TENORMIN) 25 MG tablet; Take 1 tablet (25 mg total) by mouth daily.  Dispense: 90 tablet; Refill: 3  2. Substernal burning chest pain Will try Mylanta when he feels the onset of a similar  episode.  3. Transient synovitis Will initiate trial of Aleve 2 tablets twice daily for the next 7-10 days. Encouraged to RTC should his symptoms fail to improve.   Dr. Susann Givens was present for the encounter and agrees with the above assessment and plan.

## 2013-01-20 ENCOUNTER — Ambulatory Visit (INDEPENDENT_AMBULATORY_CARE_PROVIDER_SITE_OTHER): Payer: BC Managed Care – PPO | Admitting: Physician Assistant

## 2013-01-20 ENCOUNTER — Encounter: Payer: Self-pay | Admitting: Internal Medicine

## 2013-01-20 ENCOUNTER — Encounter: Payer: Self-pay | Admitting: Physician Assistant

## 2013-01-20 VITALS — BP 140/90 | HR 79 | Ht 68.0 in | Wt 192.0 lb

## 2013-01-20 DIAGNOSIS — M25579 Pain in unspecified ankle and joints of unspecified foot: Secondary | ICD-10-CM | POA: Insufficient documentation

## 2013-01-20 DIAGNOSIS — I10 Essential (primary) hypertension: Secondary | ICD-10-CM

## 2013-01-20 DIAGNOSIS — M25572 Pain in left ankle and joints of left foot: Secondary | ICD-10-CM

## 2013-01-20 DIAGNOSIS — R0789 Other chest pain: Secondary | ICD-10-CM

## 2013-01-20 DIAGNOSIS — R079 Chest pain, unspecified: Secondary | ICD-10-CM

## 2013-01-20 NOTE — Assessment & Plan Note (Signed)
Bp well controlled on current therapy.

## 2013-01-20 NOTE — Progress Notes (Signed)
Date:  01/20/2013   ID:  Derrick Garcia, DOB 12/31/1975, MRN [id number]  PCP:  Carollee Herter, MD  Primary Cardiologist:  Allyson Sabal     History of Present Illness: Derrick Garcia is a 37 y.o. thin-appearing, married [place] American male, father of 2 children, who saw Dr. Allyson Sabal one year ago. He has a history of atypical CP, HLD, HTN and normal 2D echocardiogram.  Both parents are living with no history of MI/CAD.  He was seen in the ER recently with atypical CP and was given a GI cocktail.  He presents today with the same symptoms which reoccurred yesterday.  The pain was very sharp on the left side of his chest and radiated toward the left axillae.  It was 7/10 and has not returned.  The patient currently denies nausea, vomiting, fever, shortness of breath, diaphoresis, orthopnea, dizziness, PND, cough, congestion, abdominal pain, hematochezia, melena, lower extremity edema, claudication.    Wt Readings from Last 3 Encounters:  01/20/13 192 lb (87.091 kg)  01/17/13 190 lb (86.183 kg)  05/23/12 190 lb (86.183 kg)     Past Medical History  Diagnosis Date  . Asthma   . Diverticulitis September 2008    Hospitalization  . Hyperlipidemia   . Hypertension   . Allergy     Current Outpatient Prescriptions  Medication Sig Dispense Refill  . albuterol (PROVENTIL HFA;VENTOLIN HFA) 108 (90 BASE) MCG/ACT inhaler Inhale 1-2 puffs into the lungs every 6 (six) hours as needed for wheezing or shortness of breath.      Marland Kitchen atenolol (TENORMIN) 25 MG tablet Take 1 tablet (25 mg total) by mouth daily.  90 tablet  3  . losartan (COZAAR) 100 MG tablet Take 1 tablet (100 mg total) by mouth daily.  90 tablet  3  . albuterol (PROVENTIL HFA;VENTOLIN HFA) 108 (90 BASE) MCG/ACT inhaler Inhale 1-2 puffs into the lungs every 6 (six) hours as needed for wheezing.  1 Inhaler  0   No current facility-administered medications for this visit.    Allergies:    Allergies  Allergen Reactions  .  Ciprocin-Fluocin-Procin [Fluocinolone Acetonide] Hives  . Penicillins Other (See Comments)    Unknown childhood reaction    Social History:  The patient  reports that he has never smoked. He has never used smokeless tobacco. He reports that he drinks alcohol. He reports that he does not use illicit drugs.   Family history:   Family History  Problem Relation Age of Onset  . Hypertension Mother   . Hypertension Father   . Heart disease Brother     Data as a child from a hole in the heart    ROS:  Please see the history of present illness.  All other systems reviewed and negative.   PHYSICAL EXAM: VS:  BP 140/90  Pulse 79  Ht 5\' 8"  (1.727 m)  Wt 192 lb (87.091 kg)  BMI 29.20 kg/m2 Well nourished, well developed, in no acute distress HEENT: Pupils are equal round react to light accommodation extraocular movements are intact.  Neck: no JVDNo cervical lymphadenopathy. Cardiac: Regular rate and rhythm without murmurs rubs or gallops. MS:  Chest wall is nontender.  Left ankle tender with active motion against resistance.  Trace edema Lungs:  clear to auscultation bilaterally, no wheezing, rhonchi or rales Abd: soft, nontender, positive bowel sounds all quadrants, no hepatosplenomegaly Ext: no lower extremity edema.  2+ radial and dorsalis pedis pulses. Skin: warm and dry Neuro:  Grossly normal  EKG: NSR 79 BPM. Nonspecific T wave changes.    ASSESSMENT AND PLAN:  Problem List Items Addressed This Visit   Hypertension     Bp well controlled on current therapy.    Atypical chest pain     Likely GI related.  Gas pains.  Try maalox, tums.  Decrease the amount of carbohydrate in diet.    Ankle pain     Exacerbated with active plantar flexion.  Likely mild ligament strain.  He said it was worse after a slight twisting from a miss step earlier.  Rest.     Other Visit Diagnoses   Chest pain    -  Primary

## 2013-01-20 NOTE — Patient Instructions (Signed)
1.  Try maalox, Tums.  Decrease the amount of sugar/carbohydrates in your diet. 2.  Follow up as needed.

## 2013-01-20 NOTE — Assessment & Plan Note (Signed)
Exacerbated with active plantar flexion.  Likely mild ligament strain.  He said it was worse after a slight twisting from a miss step earlier.  Rest.

## 2013-01-20 NOTE — Assessment & Plan Note (Signed)
Likely GI related.  Gas pains.  Try maalox, tums.  Decrease the amount of carbohydrate in diet.

## 2013-01-23 ENCOUNTER — Encounter: Payer: Self-pay | Admitting: Physician Assistant

## 2013-02-21 ENCOUNTER — Other Ambulatory Visit: Payer: Self-pay | Admitting: Cardiovascular Disease

## 2013-10-29 IMAGING — CR DG HAND COMPLETE 3+V*R*
4 series · 4 of 4 positions shown · non-contrast
Comparison: No priors.

CLINICAL DATA: Injured little finger today while playing football.

RIGHT HAND - COMPLETE 3+ VIEW

[view not recorded (1 of 4)]
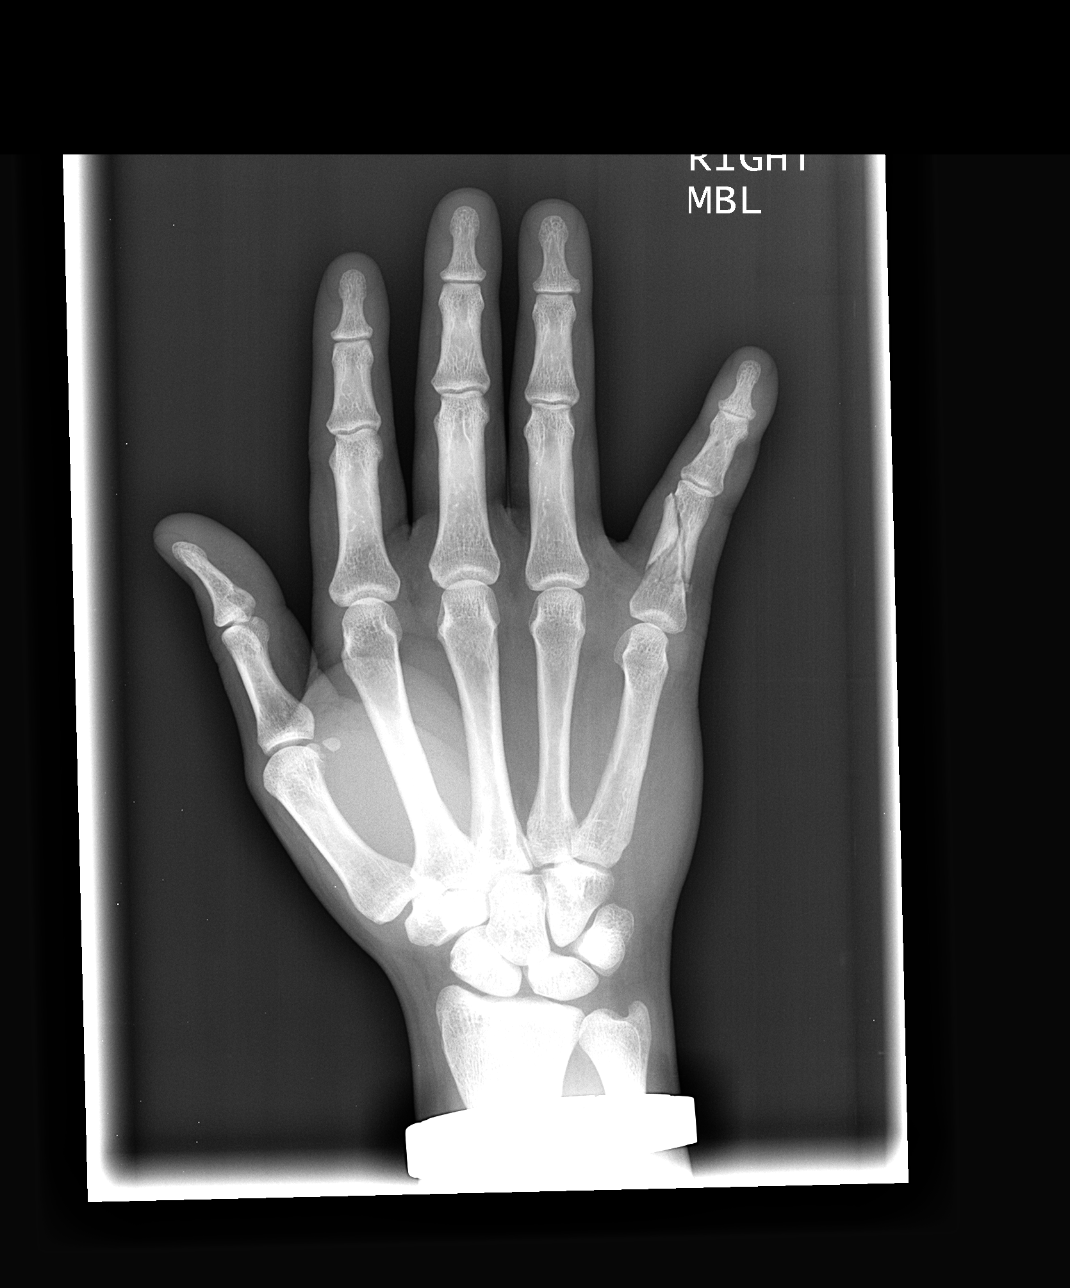

[view not recorded (2 of 4)]
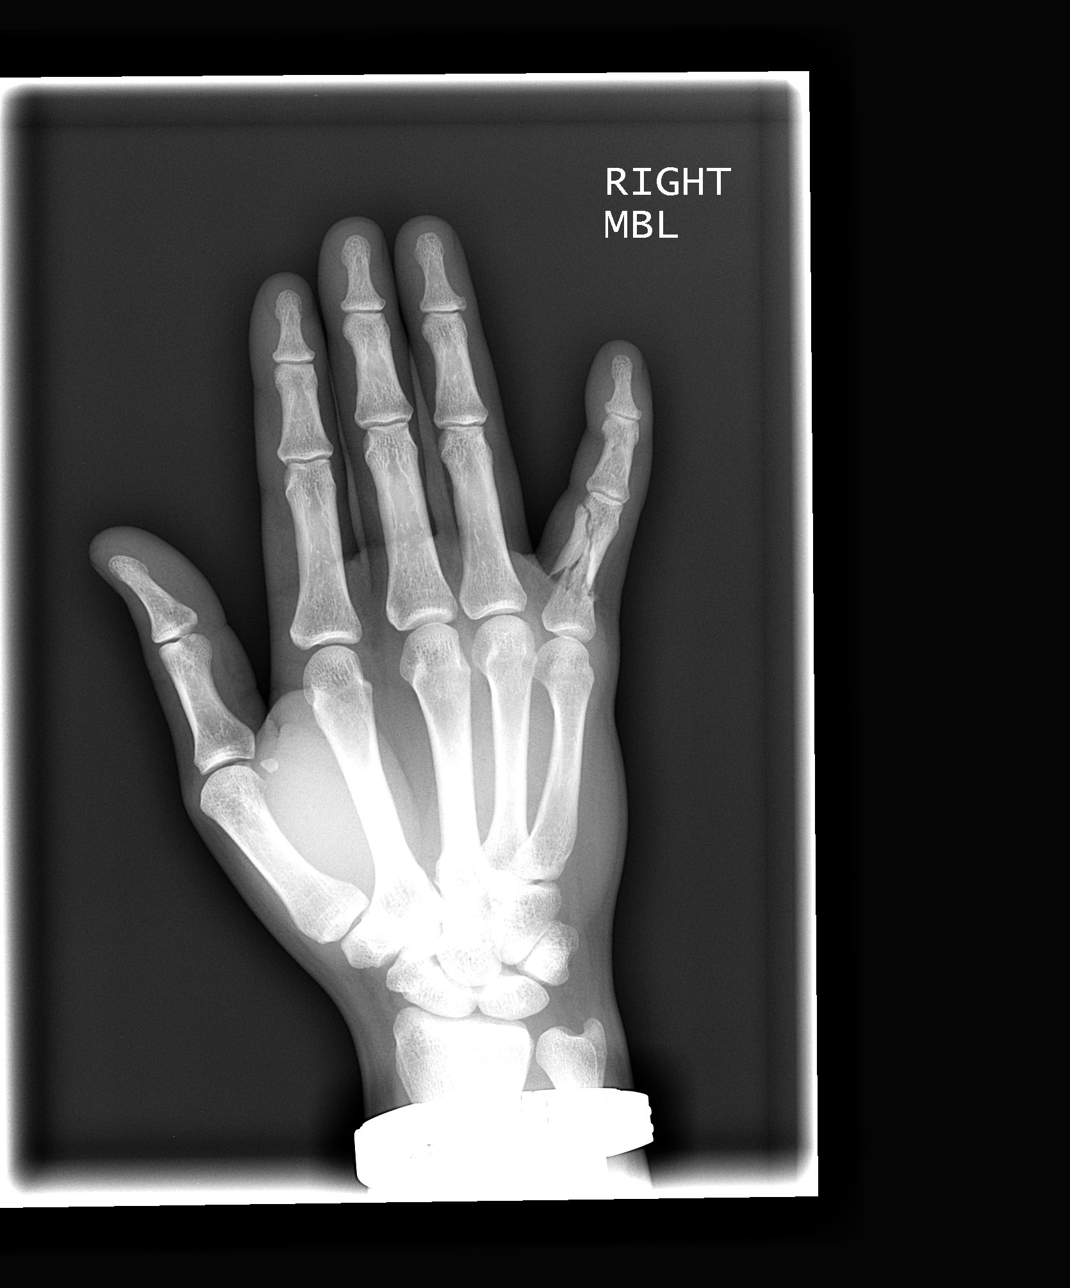

[view not recorded (3 of 4)]
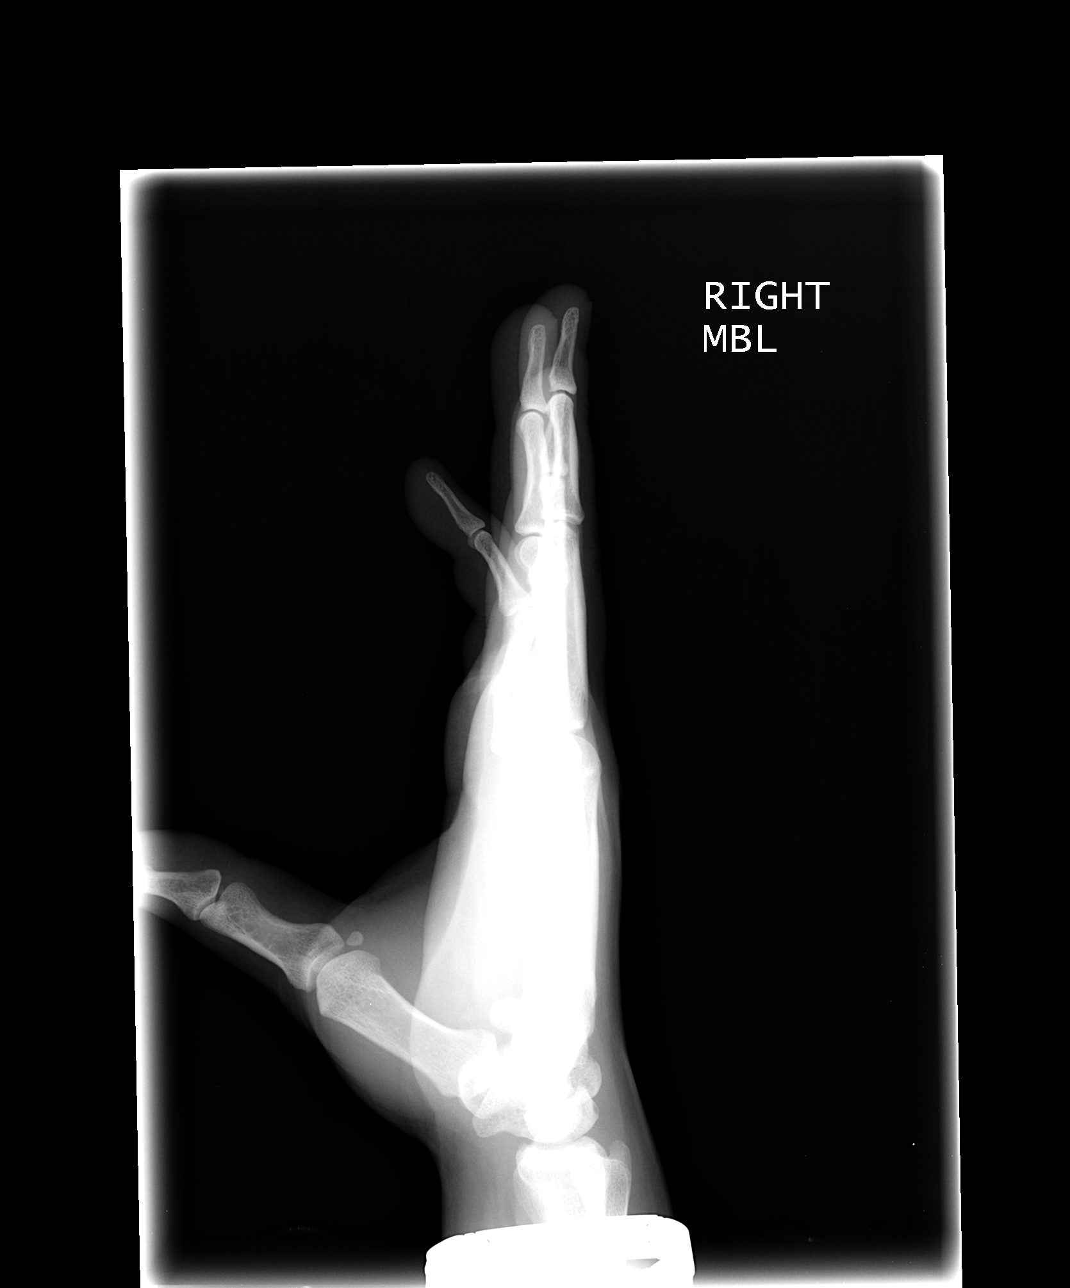

[view not recorded (4 of 4)]
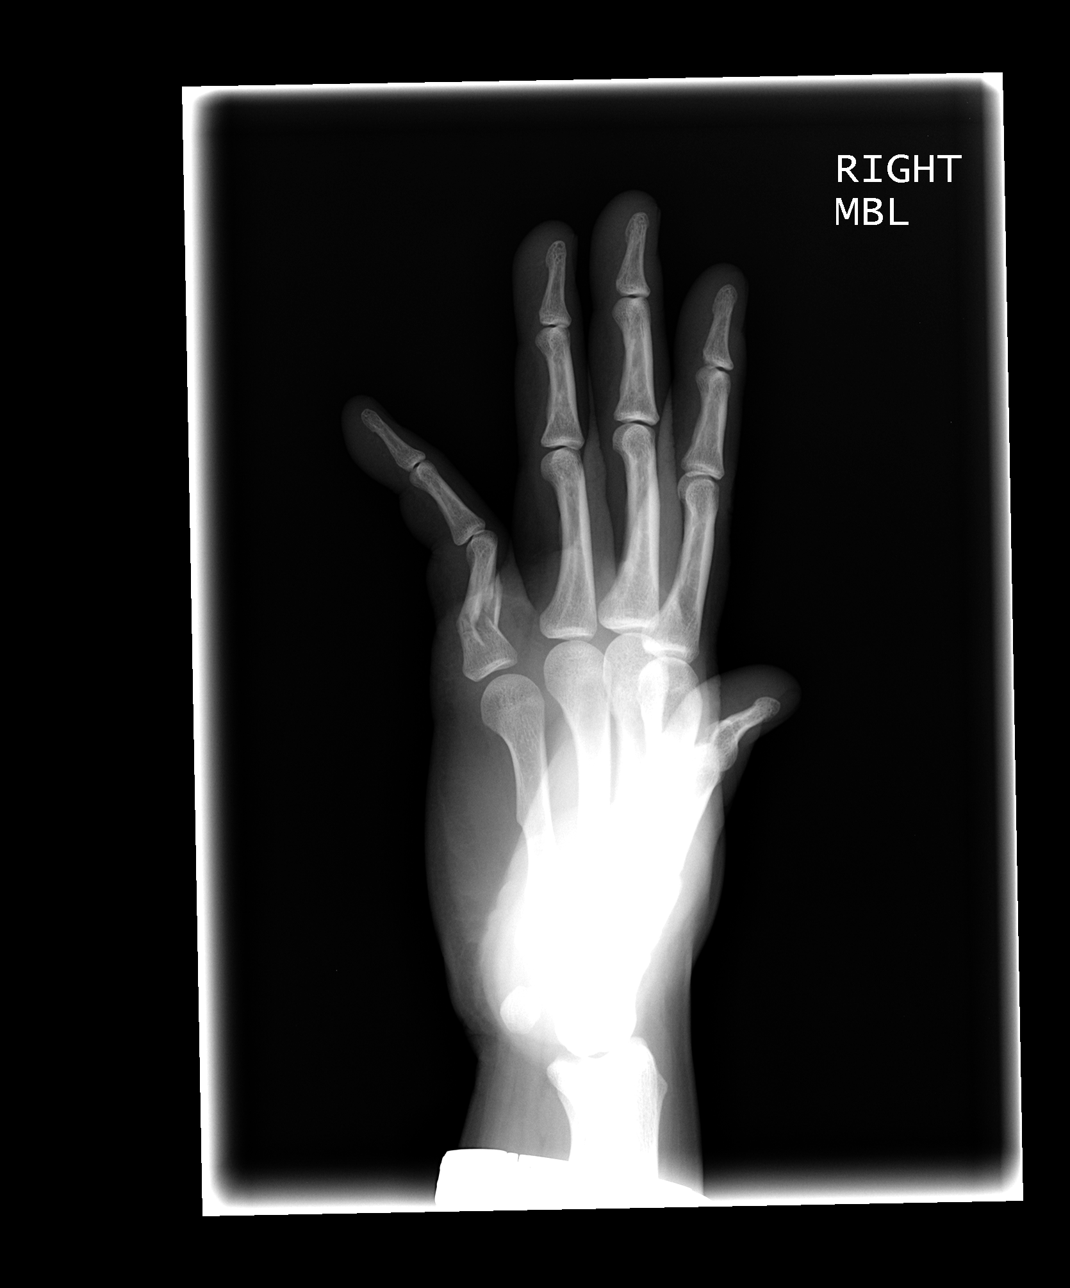

[4 of 4 positions shown; findings below may reference images not displayed]

FINDINGS: There is a comminuted fracture of the proximal phalanx of
the right fifth finger, which appears angulated on the lateral and
oblique projections (apex volar with approximately 20 degrees of
angulation).  The fracture lines do not appear to extend to the
articular surface of either the fifth metacarpal phalangeal joint,
or the fifth proximal interphalangeal joint.  The remainder of the
bones of the right hand otherwise appear intact.
IMPRESSION: 1.  Comminuted and mildly angulated fracture of the proximal
phalanx of the right fifth finger, as above.

## 2013-12-22 ENCOUNTER — Encounter: Payer: Self-pay | Admitting: Internal Medicine

## 2014-02-14 ENCOUNTER — Other Ambulatory Visit: Payer: Self-pay | Admitting: Family Medicine

## 2014-07-04 ENCOUNTER — Encounter: Payer: Self-pay | Admitting: Medical

## 2014-07-04 ENCOUNTER — Ambulatory Visit (INDEPENDENT_AMBULATORY_CARE_PROVIDER_SITE_OTHER): Payer: BLUE CROSS/BLUE SHIELD | Admitting: Medical

## 2014-07-04 VITALS — BP 110/80 | HR 68 | Temp 98.1°F | Resp 15 | Wt 198.0 lb

## 2014-07-04 DIAGNOSIS — B358 Other dermatophytoses: Secondary | ICD-10-CM

## 2014-07-04 MED ORDER — TERBINAFINE HCL 1 % EX CREA: 1.0000 "application " | TOPICAL_CREAM | Freq: Two times a day (BID) | CUTANEOUS | Status: DC

## 2014-07-04 NOTE — Progress Notes (Signed)
Subjective Here for ringworm.   Over the last few days had a round rash pop up on left face, thinks he got it from contamination from barber's tools. No contacts with similar.  No other aggravating or relieving factors. No other complaint.  Objective: BP 110/80 mmHg  Pulse 68  Temp(Src) 98.1 F (36.7 C) (Oral)  Resp 15  Wt 198 lb (89.812 kg)  Gen: wd, wn, nad, AA male Skin: left face lateral to temple but not within hair line with 1.8 cm diameter round raised brown rash.  No other rash or abnormality  Assessment: Encounter Diagnosis  Name Primary?  . Facial ringworm Yes    Plan: Declines KOH prep.  clinically appears to be early ringworm.   Begin Lamisil, discussed hygiene, preventative measures.  discussed usual time frame to see improvements.  If not improving or worse within 2 weeks, then recheck.

## 2014-09-04 ENCOUNTER — Encounter: Payer: Self-pay | Admitting: Cardiovascular Disease

## 2014-09-18 ENCOUNTER — Encounter: Payer: Self-pay | Admitting: Cardiovascular Disease

## 2015-01-28 ENCOUNTER — Encounter: Payer: Self-pay | Admitting: Medical

## 2015-01-28 ENCOUNTER — Ambulatory Visit (INDEPENDENT_AMBULATORY_CARE_PROVIDER_SITE_OTHER): Payer: BLUE CROSS/BLUE SHIELD | Admitting: Medical

## 2015-01-28 VITALS — BP 140/94 | HR 76 | Resp 12 | Wt 191.6 lb

## 2015-01-28 DIAGNOSIS — K5732 Diverticulitis of large intestine without perforation or abscess without bleeding: Secondary | ICD-10-CM | POA: Diagnosis not present

## 2015-01-28 DIAGNOSIS — I10 Essential (primary) hypertension: Secondary | ICD-10-CM

## 2015-01-28 DIAGNOSIS — R1032 Left lower quadrant pain: Secondary | ICD-10-CM

## 2015-01-28 MED ORDER — METRONIDAZOLE 500 MG PO TABS: 500.0000 mg | ORAL_TABLET | Freq: Two times a day (BID) | ORAL | Status: DC

## 2015-01-28 MED ORDER — SULFAMETHOXAZOLE-TRIMETHOPRIM 800-160 MG PO TABS: 1.0000 | ORAL_TABLET | Freq: Two times a day (BID) | ORAL | Status: DC

## 2015-01-28 NOTE — Progress Notes (Signed)
Subjective: Chief Complaint  Patient presents with  . diverticuitis flare up    last flare up 10 years ago. cramping pain in lower left side (not constant), bloated. started saturday night.   Here with wife for possible diverticulitis.   He has had this originally 2007, hospitalized at that time for this, then has been here in 2013 and 2014 for similar.  He reports few day hx/o LLQ abdominal pain, intermittent, but no fever, no loose stool or blood in stool, no back pain, no dysuria, no nausea or vomiting.   He notes that this discomfort is the same he felt with diverticulitis in the past.  No prior abdominal surgery, renal stones, UTI, or prostatitis.  Last colonoscopy was back in 2007 as well showing diverticulosis of colon.  No other aggravating or relieving factors.   BP is elevated today, but he notes normal home readings, is compliant with BP medication and notes that he is dealing with stress of his 40yo son.  He is involved with a girl who is "trouble" and he is trying to keep him from being dragged into a bad relationship.   No other complaint.  Objective: BP 140/94 mmHg  Pulse 76  Resp 12  Wt 191 lb 9.6 oz (86.909 kg)  BP Readings from Last 3 Encounters:  01/28/15 140/94  07/04/14 110/80  01/20/13 140/90    General appearance: alert, no distress, WD/WN Heart: RRR, normal S1, S2, no murmurs Lungs: CTA bilaterally, no wheezes, rhonchi, or rales Abdomen: +bs, soft, mild LLQ tenderness, otherwise non tender, non distended, no masses, no hepatomegaly, no splenomegaly Pulses: 2+ symmetric, upper and lower extremities, normal cap refill Back: nontender GU: declined   Assessment: Encounter Diagnoses  Name Primary?  . Diverticulitis of colon Yes  . LLQ abdominal pain   . Essential hypertension     Plan: Discussed differential diagnosis, other evaluation such as labs/CT, but he wants to try treatment for now given similarity to the prior times he has been treated for the  same.   Begin Bactrim and Flagyl which have worked prior, discussed clear fluid diet for the next 24-48 hours, and advance bland diet in small portion as symptom improve.    Advised that if he continues to get these episodes, may need to go back and see GI.   Fortunately he has acted on symptoms early in the course this time and the last 2 times.  Call/recheck if worse or not much improved by the end of the week.    HTN - return soon for physical, c/t same medication

## 2015-03-19 ENCOUNTER — Other Ambulatory Visit: Payer: Self-pay | Admitting: Family Medicine

## 2015-06-18 ENCOUNTER — Ambulatory Visit (INDEPENDENT_AMBULATORY_CARE_PROVIDER_SITE_OTHER): Payer: BLUE CROSS/BLUE SHIELD | Admitting: Medical

## 2015-06-18 ENCOUNTER — Encounter: Payer: Self-pay | Admitting: Medical

## 2015-06-18 VITALS — BP 138/90 | HR 90 | Wt 190.0 lb

## 2015-06-18 DIAGNOSIS — Z8719 Personal history of other diseases of the digestive system: Secondary | ICD-10-CM

## 2015-06-18 DIAGNOSIS — R1032 Left lower quadrant pain: Secondary | ICD-10-CM | POA: Diagnosis not present

## 2015-06-18 MED ORDER — SULFAMETHOXAZOLE-TRIMETHOPRIM 800-160 MG PO TABS: 1.0000 | ORAL_TABLET | Freq: Two times a day (BID) | ORAL | Status: DC

## 2015-06-18 MED ORDER — METRONIDAZOLE 500 MG PO TABS
500.0000 mg | ORAL_TABLET | Freq: Two times a day (BID) | ORAL | Status: DC
Start: 2015-06-18 — End: 2015-08-22

## 2015-06-18 NOTE — Progress Notes (Signed)
Subjective: Chief Complaint  Patient presents with  . diverticulitis flare    has not changed his diet much. did liquid diet friday and saturday. ate tacos on sunday.    Here for possible diverticulitis. Having some pain and bloating in left lower abdomen x 4-5 days.  Did liquid diet the weekend, symptoms resolved, then after eating tacos Sunday, things seemed to flare up.  No fever, no nausea, no vomiting, no blood in stool, no diarrhea.   Has had normal BM yesterday evening.   No urinary c/o.   No mucous in the stool.    No chest pain, no dyspnea.  No back pain.  No other aggravating or relieving factors. No other complaint.   Past Medical History  Diagnosis Date  . Asthma   . Diverticulitis September 2008    Hospitalization  . Hyperlipidemia   . Hypertension   . Allergy     History reviewed. No pertinent past surgical history.  Objective: BP 138/90 mmHg  Pulse 90  Wt 190 lb (86.183 kg)  General appearance: alert, no distress, WD/WN Oral cavity: MMM, no lesions Neck: supple, no lymphadenopathy, no thyromegaly, no masses Heart: RRR, normal S1, S2, no murmurs Lungs: CTA bilaterally, no wheezes, rhonchi, or rales Back: nontender Abdomen: +somewhat decreased bs, soft, +LLQ tenderness, otherwise non tender, non distended, no masses, no hepatomegaly, no splenomegaly Pulses: 2+ symmetric, upper and lower extremities, normal cap refill   Assessment Encounter Diagnoses  Name Primary?  Marland Kitchen LLQ abdominal pain Yes  . History of diverticulitis      Plan: He has hx/o hospitalization for diverticulitis back in 2008.  Since then has been treated here about 4 times in the past several years for diverticulitis, averaging about once per year the last few years.   Symptoms suggest mild diverticulitis clinically today.  Will treat for diverticulitis, but refer back to GI for consult regarding mild recurrent diverticulitis.  No imaging today, but will defer imaging or endoscopy to GI given the  history.   Reviewed prior 2007 colonoscopy report showing diverticulosis.    Derrick Garcia was seen today for diverticulitis flare.  Diagnoses and all orders for this visit:  LLQ abdominal pain -     POCT urinalysis dipstick -     metroNIDAZOLE (FLAGYL) 500 MG tablet; Take 1 tablet (500 mg total) by mouth 2 (two) times daily. -     sulfamethoxazole-trimethoprim (BACTRIM DS,SEPTRA DS) 800-160 MG tablet; Take 1 tablet by mouth 2 (two) times daily. -     Ambulatory referral to Gastroenterology  History of diverticulitis -     POCT urinalysis dipstick -     metroNIDAZOLE (FLAGYL) 500 MG tablet; Take 1 tablet (500 mg total) by mouth 2 (two) times daily. -     sulfamethoxazole-trimethoprim (BACTRIM DS,SEPTRA DS) 800-160 MG tablet; Take 1 tablet by mouth 2 (two) times daily. -     Ambulatory referral to Gastroenterology

## 2015-06-20 ENCOUNTER — Telehealth: Payer: Self-pay

## 2015-06-20 NOTE — Telephone Encounter (Signed)
Pt called and would like to know if we could refer him for a vasectomy or if he could make appt on his own?

## 2015-06-20 NOTE — Telephone Encounter (Signed)
Pt aware and faxed referral

## 2015-06-20 NOTE — Telephone Encounter (Signed)
Go ahead and refer to urology for vasectomy consult

## 2015-07-21 IMAGING — CR DG CHEST 2V
2 series · 2 of 2 positions shown · non-contrast
Comparison: Prior radiograph from 06/27/2010

CLINICAL DATA: Chest pain

EXAM:
CHEST  2 VIEW

[w chest pa]
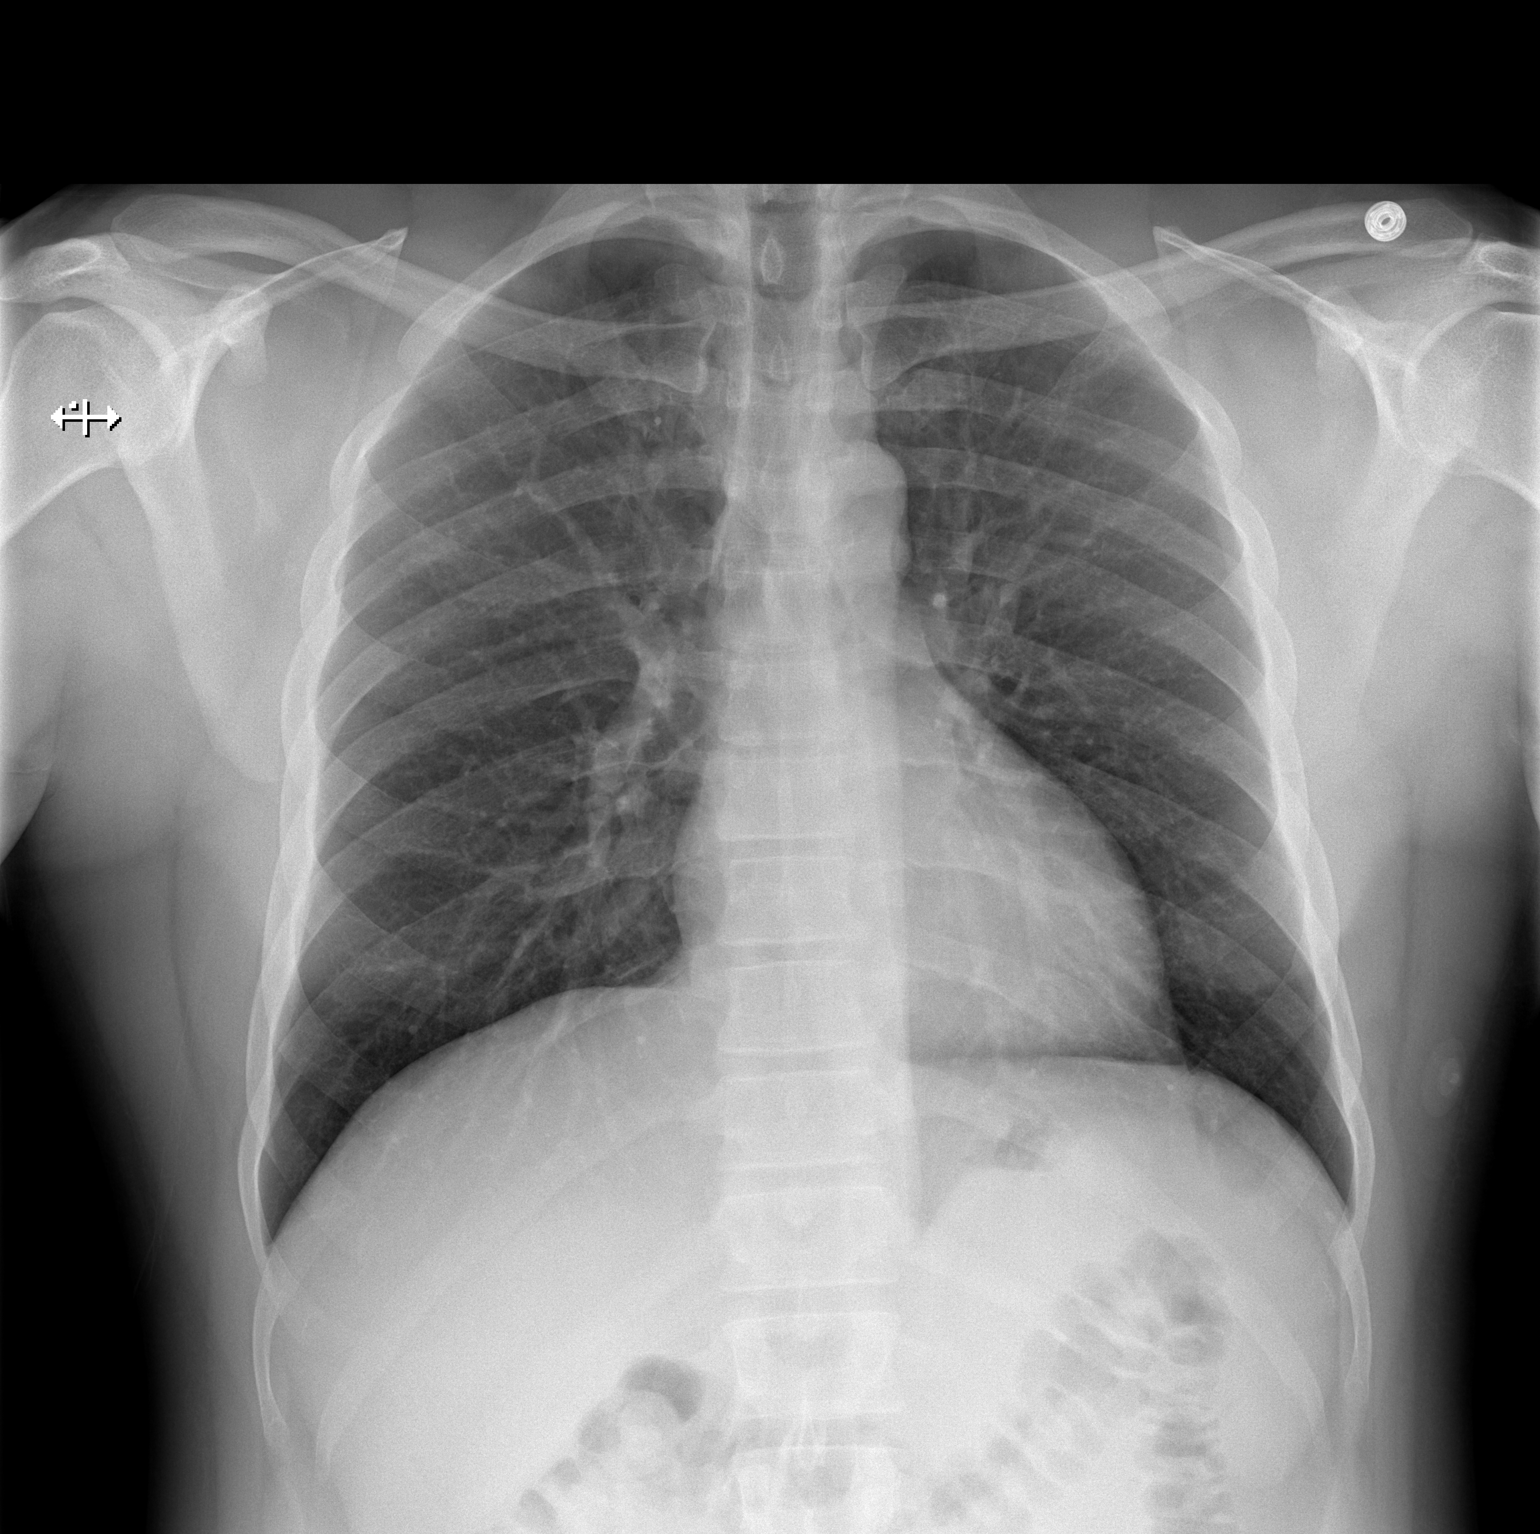

[w chest lat]
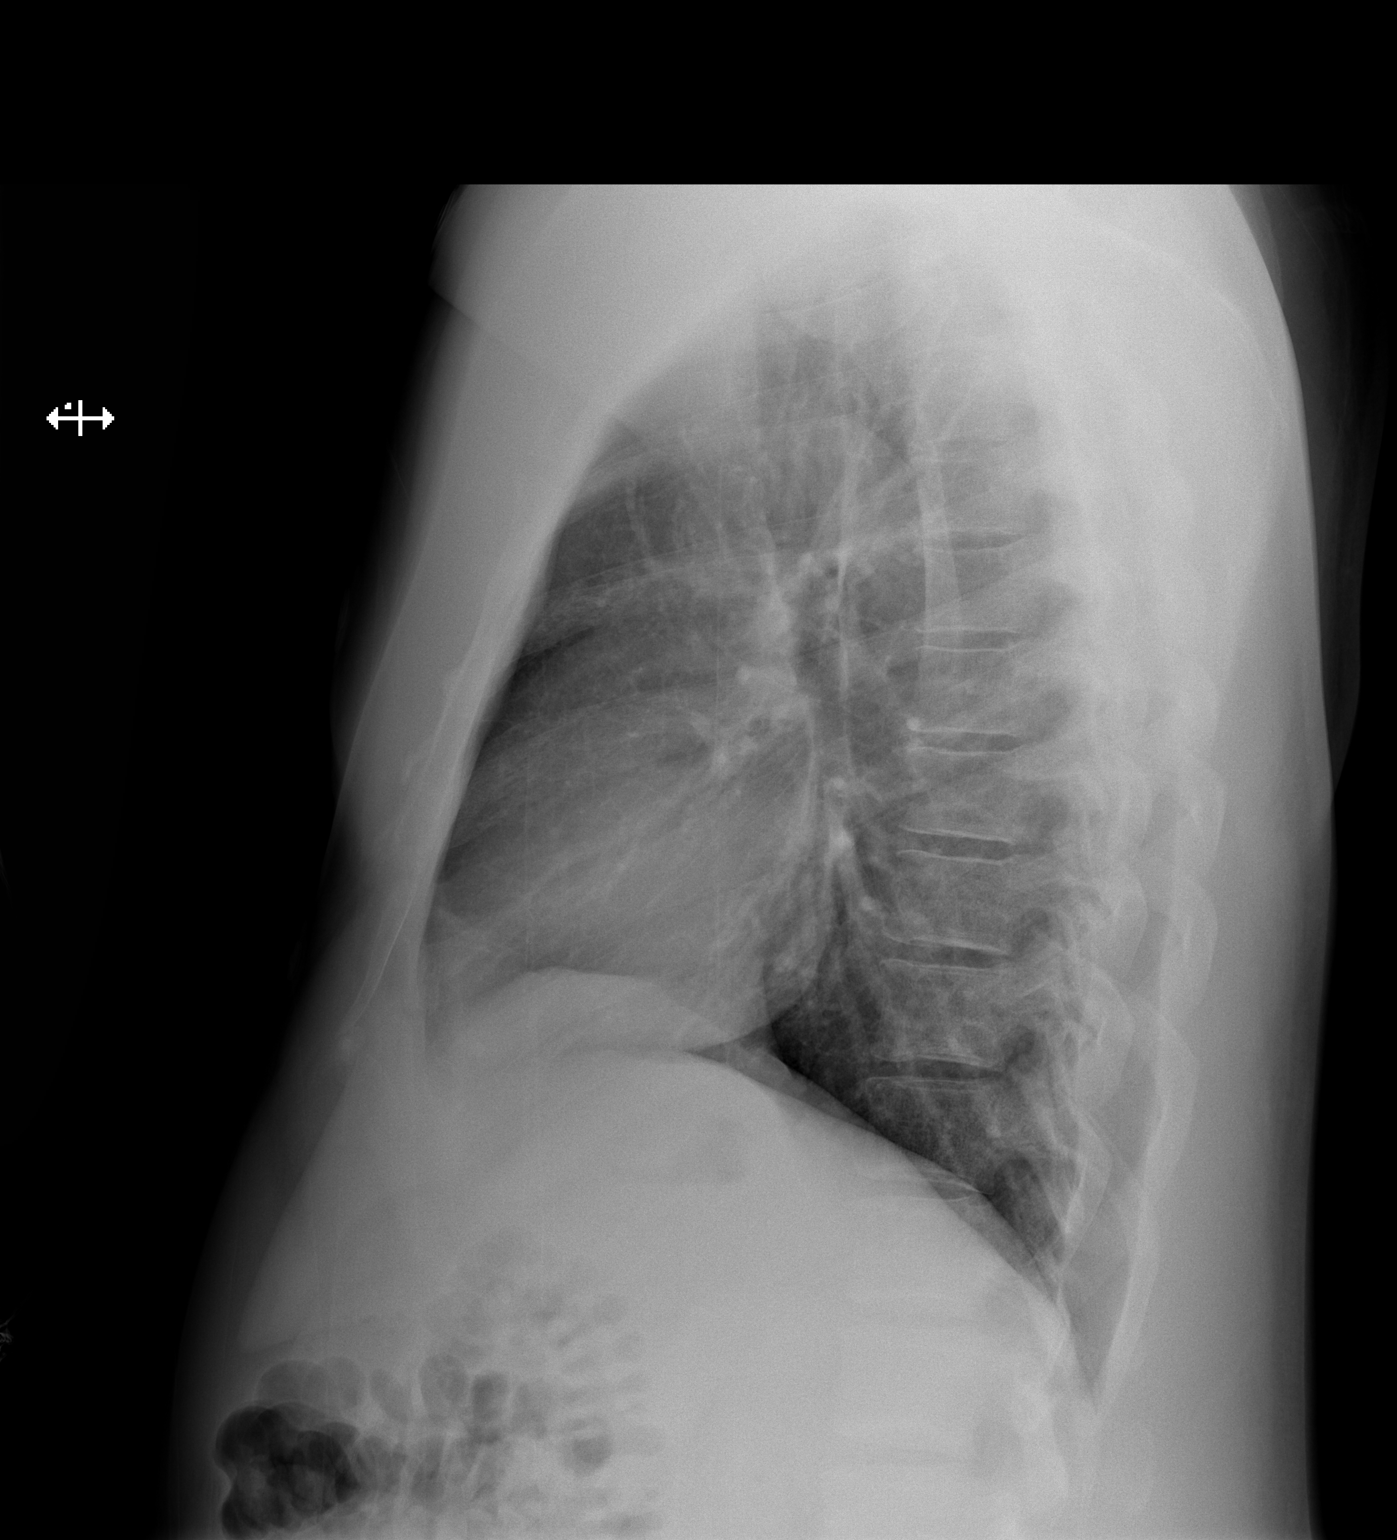

[2 of 2 positions shown; findings below may reference images not displayed]

FINDINGS: The cardiac and mediastinal silhouettes are stable in size and
contour, and remain within normal limits.

Lungs are normally inflated. No focal infiltrate, pleural effusion,
or pulmonary edema. There is no pneumothorax.

Osseous structures are within normal limits.
IMPRESSION: No active cardiopulmonary disease.

## 2015-08-16 DIAGNOSIS — Z3009 Encounter for other general counseling and advice on contraception: Secondary | ICD-10-CM | POA: Diagnosis not present

## 2015-08-22 ENCOUNTER — Encounter: Payer: Self-pay | Admitting: Internal Medicine

## 2015-08-22 ENCOUNTER — Ambulatory Visit (INDEPENDENT_AMBULATORY_CARE_PROVIDER_SITE_OTHER): Payer: BLUE CROSS/BLUE SHIELD | Admitting: Internal Medicine

## 2015-08-22 VITALS — BP 112/80 | HR 72 | Ht 67.75 in | Wt 180.4 lb

## 2015-08-22 DIAGNOSIS — K5732 Diverticulitis of large intestine without perforation or abscess without bleeding: Secondary | ICD-10-CM

## 2015-08-22 DIAGNOSIS — R1032 Left lower quadrant pain: Secondary | ICD-10-CM

## 2015-08-22 DIAGNOSIS — K5730 Diverticulosis of large intestine without perforation or abscess without bleeding: Secondary | ICD-10-CM

## 2015-08-22 NOTE — Patient Instructions (Signed)
   Today you have been given a handout on High Fiber and Diverticulitis to read.    Call us if you have another flare.      I appreciate the opportunity to care for you. Silvano Rusk, MD, [facility]

## 2015-08-22 NOTE — Progress Notes (Signed)
Referred by: Carlena Hurl, PA-C [place], [place] [id number]  Cc: LLQ pain  HPI: [name] is a  40 year old African-American male  who was referred to me by Carlena Hurl, PA-C for a complaint of diverticulitis.  Per chart review, patient did have a colonoscopy on 01/05/2006 performed by Dr. Carlean Purl, independent review of report and images shows a polyp at the splenic flexure and diverticulosis in the sigmoid and descending colon. Pathology returned showing benign colonic mucosa with small lymphoid aggregate.   Today, the patient presents to clinic and explains that in 2007 he had his first episode of diverticulitis for which he was hospitalized. Shortly thereafter he had a colonoscopy, as above. The patient did well for almost 10 years, but within the past year has had 3 separate "flares" of his diverticulitis. Patient describes these flares during which he has a 3/10 left lower quadrant pain which does not radiate anywhere and is not made worse by anything. He has proceeded to his primary care provider for 2 of these episodes and responded well to antibiotics. He controlled his other episode with a diet change to liquid foods for a few days and his pain did go away. The patient believes that his diet causes this pain and specifically tells me that he seems to have issues with "pizza". Patient does tell me he also drinks beer, "more on the weekends", describing at least a 6 pack. The patient tells me he knows he should "eat better" in between episodes and is aware that he should be on a high fiber diet. Associated symptoms during flares include bloating.  Patient denies fever, chills, blood in his stool, melena, change in bowel habits, weight loss, fatigue, anorexia, nausea, vomiting, heartburn, reflux or symptoms that awaken him at night.   Past Medical History  Diagnosis Date  . Asthma   . Diverticulitis September 2008    Hospitalization  . Hyperlipidemia   .  Hypertension   . Allergy   . Colon polyps     Past Surgical History  Procedure Laterality Date  . Colonoscopy w/ biopsies    . Finger surgery Right     broken pinky finger    Current Outpatient Prescriptions  Medication Sig Dispense Refill  . atenolol (TENORMIN) 25 MG tablet TAKE 1 TABLET (25 MG TOTAL) BY MOUTH DAILY. 90 tablet 3  . losartan (COZAAR) 100 MG tablet TAKE 1 TABLET (100 MG TOTAL) BY MOUTH DAILY. 90 tablet 3  . albuterol (PROVENTIL HFA;VENTOLIN HFA) 108 (90 BASE) MCG/ACT inhaler Inhale 1-2 puffs into the lungs every 6 (six) hours as needed for wheezing or shortness of breath. Reported on 08/22/2015     No current facility-administered medications for this visit.    Allergies as of 08/22/2015 - Review Complete 08/22/2015  Allergen Reaction Noted  . Ciprocin-fluocin-procin [fluocinolone acetonide] Hives   . Ciprofloxacin  08/22/2015  . Penicillins Other (See Comments)     Family History  Problem Relation Age of Onset  . Hypertension Mother   . Hypertension Father   . Heart disease Brother     Data as a child from a hole in the heart    Social History   Social History  . Marital Status: Married    Spouse Name: N/A  . Number of Children: 3  . Years of Education: N/A   Occupational History  . Dealer    Social History Main Topics  . Smoking status: Never Smoker   . Smokeless tobacco: Never  Used  . Alcohol Use: 4.2 oz/week    7 Standard drinks or equivalent per week     Comment: socially  . Drug Use: No  . Sexual Activity: Yes    Birth Control/ Protection: None     Comment: very brief prior smoking history less than one year; married, 2 children; works as a Field seismologist for [employer]   Other Topics Concern  . Not on file   Social History Narrative    Review of Systems:    Constitutional:See history of present illness and otherwise negative Cardiovascular: No chest pain or palpitations     Respiratory: No SOB or  cough Gastrointestinal: See HPI and otherwise negative Genitourinary: No change in urinary frequency Neurological: No headache or dizziness Musculoskeletal: No muscle or back pain Hematologic: No bleeding Psychiatric: No history of depression or anxiety Endocrinologic: No reports of sweating    Physical Exam:  Vital signs: BP 112/80 mmHg  Pulse 72  Ht 5' 7.75" (1.721 m)  Wt 180 lb 6 oz (81.818 kg)  BMI 27.62 kg/m2  General:   Pleasant African-American male appears to be in NAD, Well developed, Well nourished, alert and cooperative Head:  Normocephalic and atraumatic. Eyes:   PEERL, EOMI. No icterus. Conjunctiva pink. Ears:  Normal auditory acuity. Neck:  Supple Throat: Oral cavity and pharynx without inflammation, swelling or lesion. Teeth in good condition. Lungs: Respirations even and unlabored. Lungs clear to auscultation bilaterally.   No wheezes, crackles, or rhonchi.  Heart: Normal S1, S2. No MRG. Regular rate and rhythm. No peripheral edema, cyanosis or pallor.  Abdomen:  Soft, nondistended, nontender. No rebound or guarding. Normal bowel sounds. No appreciable masses or hepatomegaly. Rectal:  Not performed.  Msk:  Symmetrical without gross deformities.  Extremities:  Without edema, no deformity or joint abnormality.  Neurologic:  Alert and  oriented x4;  grossly normal neurologically.  Skin:   Dry and intact without significant lesions or rashes. Psychiatric: Oriented to person, place and time. Demonstrates good judgement and reason without abnormal affect or behaviors.  Assessment: 1. Left lower quadrant pain: Episode of suspected diverticulitis in 2007, colonoscopy in November 2007 did show diverticula in the sigmoid and descending colon and was otherwise normal; patient with 3 separate attacks within the past year, 2 controlled with antibiotics and one controlled with a diet change-could consider colonic spasm versus infection and inflammation 2. Diverticulosis of the  colon without hemorrhage: Confirmed via colonoscopy in 2007 3. Diverticulitis?: Suspected as above  Plan: 1. It appears patient has never had imaging during an acute flare of diverticulitis, thus we have no radiographic evidence to confirm this diagnosis. Could consider colonic spasm versus diverticulitis versus other. Recommended the patient call us at time of his next flare and we will order a CT of the abdomen and pelvis with contrast for further definition. Patient verbalizes understanding. 2. Recommend the patient maintain a high-fiber diet in between flares. Did provide the patient with a handout regarding this. We discuss Benefiber supplementation on a daily basis. 3. Provided the patient with information regarding diverticulosis. 4. Did discuss the possibility of elective surgery in the future for more frequent attacks. 5. Placed the patient in 10 year recall for a colonoscopy. 6. Patient to follow up in clinic as needed in the future or in 10 years for his colonoscopy.   Ellouise Newer, PA-C [facility] 08/22/2015, 9:31 AM  I saw the patient with Ms. Fabio Asa and mutually formulated asessment and plan Cc: Tysinger, Camelia Eng,  PA-C

## 2015-10-04 DIAGNOSIS — Z302 Encounter for sterilization: Secondary | ICD-10-CM | POA: Diagnosis not present

## 2016-03-02 ENCOUNTER — Other Ambulatory Visit: Payer: Self-pay | Admitting: Family Medicine

## 2016-03-30 ENCOUNTER — Other Ambulatory Visit: Payer: Self-pay | Admitting: Medical

## 2016-03-30 NOTE — Telephone Encounter (Signed)
Called and l/m for pt to call us back to make an appt for  Refills

## 2016-04-10 ENCOUNTER — Other Ambulatory Visit: Payer: Self-pay

## 2016-04-10 ENCOUNTER — Telehealth: Payer: Self-pay | Admitting: Family Medicine

## 2016-04-10 MED ORDER — ATENOLOL 25 MG PO TABS: ORAL_TABLET | ORAL | 0 refills | Status: DC

## 2016-04-10 MED ORDER — LOSARTAN POTASSIUM 100 MG PO TABS: ORAL_TABLET | ORAL | 0 refills | Status: DC

## 2016-04-10 NOTE — Telephone Encounter (Signed)
I have refilled both meds for 30 days only as it has been 01/2015 when last came in due to BP

## 2016-04-10 NOTE — Telephone Encounter (Signed)
Pt called and made a CPE appt for middle of march. Needs refills on losartan and atenolol until appt. He is completely out. Please send to [facility].

## 2016-04-27 ENCOUNTER — Ambulatory Visit (INDEPENDENT_AMBULATORY_CARE_PROVIDER_SITE_OTHER): Payer: BLUE CROSS/BLUE SHIELD | Admitting: Family Medicine

## 2016-04-27 ENCOUNTER — Encounter: Payer: Self-pay | Admitting: Family Medicine

## 2016-04-27 VITALS — BP 126/88 | HR 70 | Ht 68.0 in | Wt 190.0 lb

## 2016-04-27 DIAGNOSIS — Z8719 Personal history of other diseases of the digestive system: Secondary | ICD-10-CM | POA: Diagnosis not present

## 2016-04-27 DIAGNOSIS — Z0000 Encounter for general adult medical examination without abnormal findings: Secondary | ICD-10-CM | POA: Diagnosis not present

## 2016-04-27 DIAGNOSIS — E785 Hyperlipidemia, unspecified: Secondary | ICD-10-CM

## 2016-04-27 DIAGNOSIS — J301 Allergic rhinitis due to pollen: Secondary | ICD-10-CM | POA: Diagnosis not present

## 2016-04-27 DIAGNOSIS — J45909 Unspecified asthma, uncomplicated: Secondary | ICD-10-CM | POA: Insufficient documentation

## 2016-04-27 DIAGNOSIS — J4520 Mild intermittent asthma, uncomplicated: Secondary | ICD-10-CM

## 2016-04-27 DIAGNOSIS — I10 Essential (primary) hypertension: Secondary | ICD-10-CM

## 2016-04-27 LAB — POCT URINALYSIS DIPSTICK
BILIRUBIN UA: NEGATIVE
Glucose, UA: NEGATIVE
Ketones, UA: NEGATIVE
Leukocytes, UA: NEGATIVE
NITRITE UA: NEGATIVE
Protein, UA: NEGATIVE
RBC UA: NEGATIVE
Spec Grav, UA: 1.02
UROBILINOGEN UA: NEGATIVE — AB
pH, UA: 6

## 2016-04-27 LAB — COMPREHENSIVE METABOLIC PANEL
ALK PHOS: 62 U/L (ref 40–115)
ALT: 17 U/L (ref 9–46)
AST: 14 U/L (ref 10–40)
Albumin: 4.6 g/dL (ref 3.6–5.1)
BUN: 14 mg/dL (ref 7–25)
CO2: 28 mmol/L (ref 20–31)
Calcium: 9.6 mg/dL (ref 8.6–10.3)
Chloride: 102 mmol/L (ref 98–110)
Creat: 1.26 mg/dL (ref 0.60–1.35)
GLUCOSE: 101 mg/dL — AB (ref 65–99)
POTASSIUM: 4.2 mmol/L (ref 3.5–5.3)
Sodium: 139 mmol/L (ref 135–146)
Total Bilirubin: 1.3 mg/dL — ABNORMAL HIGH (ref 0.2–1.2)
Total Protein: 7.7 g/dL (ref 6.1–8.1)

## 2016-04-27 LAB — LIPID PANEL
CHOL/HDL RATIO: 5.6 ratio — AB (ref <5.0)
Cholesterol: 213 mg/dL — ABNORMAL HIGH (ref <200)
HDL: 38 mg/dL — AB (ref >40)
LDL Cholesterol: 146 mg/dL — ABNORMAL HIGH (ref <100)
Triglycerides: 146 mg/dL (ref <150)
VLDL: 29 mg/dL (ref <30)

## 2016-04-27 LAB — CBC WITH DIFFERENTIAL/PLATELET
BASOS PCT: 0 %
Basophils Absolute: 0 cells/uL (ref 0–200)
EOS ABS: 72 {cells}/uL (ref 15–500)
Eosinophils Relative: 1 %
HCT: 41.4 % (ref 38.5–50.0)
Hemoglobin: 14 g/dL (ref 13.2–17.1)
Lymphocytes Relative: 24 %
Lymphs Abs: 1728 cells/uL (ref 850–3900)
MCH: 30.8 pg (ref 27.0–33.0)
MCHC: 33.8 g/dL (ref 32.0–36.0)
MCV: 91 fL (ref 80.0–100.0)
MPV: 12 fL (ref 7.5–12.5)
Monocytes Absolute: 648 cells/uL (ref 200–950)
Monocytes Relative: 9 %
Neutro Abs: 4752 cells/uL (ref 1500–7800)
Neutrophils Relative %: 66 %
PLATELETS: 214 10*3/uL (ref 140–400)
RBC: 4.55 MIL/uL (ref 4.20–5.80)
RDW: 13.6 % (ref 11.0–15.0)
WBC: 7.2 10*3/uL (ref 4.0–10.5)

## 2016-04-27 MED ORDER — ATENOLOL 25 MG PO TABS: ORAL_TABLET | ORAL | 3 refills | Status: DC

## 2016-04-27 MED ORDER — LOSARTAN POTASSIUM 100 MG PO TABS: ORAL_TABLET | ORAL | 3 refills | Status: DC

## 2016-04-27 MED ORDER — ALBUTEROL SULFATE HFA 108 (90 BASE) MCG/ACT IN AERS: 1.0000 | INHALATION_SPRAY | Freq: Four times a day (QID) | RESPIRATORY_TRACT | 0 refills | Status: DC | PRN

## 2016-04-27 NOTE — Progress Notes (Signed)
   Subjective:    Patient ID: Derrick Garcia, male    DOB: Jan 09, 1976, 41 y.o.   MRN: [id number]  HPI He is here for complete examination. He does have underlying hypertension and continues on atenolol and losartan. He is having no difficulty with that. He does have underlying allergies, mainly in spring and fall and occasionally has difficulty with asthma during that timeframe. He also has a previous history of hyperlipidemia but is not on medication. He is also had trouble with diverticulitis in the past but no symptoms recently. Has had a vasectomy. His work and home life are going well. Family and social history as well as health maintenance and immunizations were reviewed.   Review of Systems  All other systems reviewed and are negative.      Objective:   Physical Exam BP 126/88   Pulse 70   Ht 5' 8"  (1.727 m)   Wt 190 lb (86.2 kg)   SpO2 99%   BMI 28.89 kg/m   General Appearance:    Alert, cooperative, no distress, appears stated age  Head:    Normocephalic, without obvious abnormality, atraumatic  Eyes:    PERRL, conjunctiva/corneas clear, EOM's intact, fundi    benign  Ears:    Normal TM's and external ear canals  Nose:   Nares normal, mucosa normal, no drainage or sinus   tenderness  Throat:   Lips, mucosa, and tongue normal; teeth and gums normal  Neck:   Supple, no lymphadenopathy;  thyroid:  no   enlargement/tenderness/nodules; no carotid   bruit or JVD     Lungs:     Clear to auscultation bilaterally without wheezes, rales or     ronchi; respirations unlabored      Heart:    Regular rate and rhythm, S1 and S2 normal, no murmur, rub   or gallop     Abdomen:     Soft, non-tender, nondistended, normoactive bowel sounds,    no masses, no hepatosplenomegaly  Genitalia:    Normal male external genitalia without lesions.  Testicles without masses.  No inguinal hernias.  Rectal:   Deferred   Extremities:   No clubbing, cyanosis or edema  Pulses:   2+ and symmetric  all extremities  Skin:   Skin color, texture, turgor normal, no rashes or lesions  Lymph nodes:   Cervical, supraclavicular, and axillary nodes normal  Neurologic:   CNII-XII intact, normal strength, sensation and gait; reflexes 2+ and symmetric throughout          Psych:   Normal mood, affect, hygiene and grooming.         Assessment & Plan:  Routine general medical examination at a health care facility - Plan: POCT Urinalysis Dipstick, CBC with Differential/Platelet, Comprehensive metabolic panel, Lipid panel  Essential hypertension - Plan: losartan (COZAAR) 100 MG tablet, atenolol (TENORMIN) 25 MG tablet  History of diverticulitis of colon  Hyperlipidemia, unspecified hyperlipidemia type  Seasonal allergic rhinitis due to pollen, unspecified chronicity  Mild intermittent extrinsic asthma without complication - Plan: albuterol (PROVENTIL HFA;VENTOLIN HFA) 108 (90 Base) MCG/ACT inhaler His medications were renewed. I chin to continue to take good care of himself.

## 2016-04-27 NOTE — Patient Instructions (Signed)
Use your work breaks for your exercise time

## 2017-05-17 ENCOUNTER — Other Ambulatory Visit: Payer: Self-pay | Admitting: Family Medicine

## 2017-05-17 DIAGNOSIS — I10 Essential (primary) hypertension: Secondary | ICD-10-CM

## 2018-04-28 ENCOUNTER — Other Ambulatory Visit: Payer: Self-pay | Admitting: Family Medicine

## 2018-04-28 DIAGNOSIS — I10 Essential (primary) hypertension: Secondary | ICD-10-CM

## 2018-04-29 NOTE — Telephone Encounter (Signed)
Called and left vm that he is overdue for an appt and until he has an appt no refills

## 2018-05-06 ENCOUNTER — Telehealth: Payer: Self-pay | Admitting: Family Medicine

## 2018-05-06 DIAGNOSIS — I10 Essential (primary) hypertension: Secondary | ICD-10-CM

## 2018-05-06 MED ORDER — LOSARTAN POTASSIUM 100 MG PO TABS: ORAL_TABLET | ORAL | 0 refills | Status: DC

## 2018-05-06 MED ORDER — ATENOLOL 25 MG PO TABS: ORAL_TABLET | ORAL | 0 refills | Status: DC

## 2018-05-06 NOTE — Telephone Encounter (Signed)
Pt called & states he is working from home and staying away from people and is concerned about coming to the office with the issue with the corona virus and would like a refill to hold him til this is over and will schedule an appt.  He has had his medicine up until this point and will run out Monday.  Can you refill his meds for 30 days?

## 2018-07-31 ENCOUNTER — Other Ambulatory Visit: Payer: Self-pay | Admitting: Family Medicine

## 2018-07-31 DIAGNOSIS — I10 Essential (primary) hypertension: Secondary | ICD-10-CM

## 2018-08-02 NOTE — Telephone Encounter (Signed)
LVM for pt to advise she would . [place]

## 2018-08-16 ENCOUNTER — Other Ambulatory Visit: Payer: Self-pay

## 2018-08-16 ENCOUNTER — Ambulatory Visit (INDEPENDENT_AMBULATORY_CARE_PROVIDER_SITE_OTHER): Payer: BC Managed Care – PPO | Admitting: Family Medicine

## 2018-08-16 ENCOUNTER — Encounter: Payer: Self-pay | Admitting: Family Medicine

## 2018-08-16 VITALS — BP 144/94 | HR 83 | Temp 97.9°F | Wt 183.0 lb

## 2018-08-16 DIAGNOSIS — E785 Hyperlipidemia, unspecified: Secondary | ICD-10-CM

## 2018-08-16 DIAGNOSIS — J301 Allergic rhinitis due to pollen: Secondary | ICD-10-CM | POA: Diagnosis not present

## 2018-08-16 DIAGNOSIS — I10 Essential (primary) hypertension: Secondary | ICD-10-CM

## 2018-08-16 DIAGNOSIS — Z8719 Personal history of other diseases of the digestive system: Secondary | ICD-10-CM | POA: Diagnosis not present

## 2018-08-16 DIAGNOSIS — J4520 Mild intermittent asthma, uncomplicated: Secondary | ICD-10-CM

## 2018-08-16 NOTE — Progress Notes (Signed)
   Subjective:    Patient ID: Derrick Garcia, male    DOB: 14-Apr-1975, 43 y.o.   MRN: [id number]  HPI Documentation for virtual telephone encounter. Documentation for virtual audio and video telecommunications through doximity encounter: The patient was located at home. The provider was located in the office. The patient did consent to this visit and is aware of possible charges through their insurance for this visit. The other persons participating in this telemedicine service were none. Time spent on call was 5 minutes and in review of previous records >25 minutes total. This virtual service is not related to other E/M service within previous 7 days. He has not been here in quite some time.  He has continued on his 2 blood pressure medications and not had any difficulty with that.  His allergies seem to be under good control.  He does have a previous history of hyperlipidemia as well as a history of diverticulitis.  He has not had any abdominal pain, nausea or vomiting.  He has not had the need for the albuterol in well over a year.  His work and home life are going quite well.  Family history is unchanged.    Review of Systems     Objective:   Physical Exam Alert and in no distress otherwise not examined      Assessment & Plan:  Seasonal allergic rhinitis due to pollen - Plan: Continue with OTC medications  Essential hypertension - Plan: CBC with Differential/Platelet, Lipid panel,   Hyperlipidemia, unspecified hyperlipidemia type - Plan: Comprehensive metabolic panel,   History of diverticulitis of colon - Plan: No therapy needed  Mild intermittent extrinsic asthma without complication - Plan: No therapy needed

## 2018-08-17 ENCOUNTER — Telehealth: Payer: Self-pay | Admitting: *Deleted

## 2018-08-17 ENCOUNTER — Other Ambulatory Visit: Payer: BC Managed Care – PPO

## 2018-08-17 ENCOUNTER — Other Ambulatory Visit: Payer: Self-pay

## 2018-08-17 DIAGNOSIS — I10 Essential (primary) hypertension: Secondary | ICD-10-CM

## 2018-08-17 DIAGNOSIS — E785 Hyperlipidemia, unspecified: Secondary | ICD-10-CM

## 2018-08-17 MED ORDER — LOSARTAN POTASSIUM 100 MG PO TABS: ORAL_TABLET | ORAL | 0 refills | Status: DC

## 2018-08-17 MED ORDER — ATENOLOL 25 MG PO TABS: ORAL_TABLET | ORAL | 0 refills | Status: DC

## 2018-08-17 NOTE — Telephone Encounter (Signed)
Patient was in for labs and bp check. I took his bp and it was 160/110, his machine read 150/103 (cuff was slightly large. He said yesterday he took it and it was 126/89 after his appt. He did say he thinks it was a little high today because he just has his blood drawn and he was anxious. He was a little concerned about the numbers though. Please advise.

## 2018-08-17 NOTE — Telephone Encounter (Signed)
Pt was advised of checking his b/p and calling next week.

## 2018-08-17 NOTE — Telephone Encounter (Signed)
Have him monitor his blood pressure since his machine seems to be accurate but have him check it only once per day and call us next week

## 2018-08-18 LAB — CBC WITH DIFFERENTIAL/PLATELET
Basophils Absolute: 0 10*3/uL (ref 0.0–0.2)
Basos: 1 %
EOS (ABSOLUTE): 0.1 10*3/uL (ref 0.0–0.4)
Eos: 2 %
Hematocrit: 42.1 % (ref 37.5–51.0)
Hemoglobin: 13.9 g/dL (ref 13.0–17.7)
Immature Grans (Abs): 0 10*3/uL (ref 0.0–0.1)
Immature Granulocytes: 0 %
Lymphocytes Absolute: 2.6 10*3/uL (ref 0.7–3.1)
Lymphs: 55 %
MCH: 30.5 pg (ref 26.6–33.0)
MCHC: 33 g/dL (ref 31.5–35.7)
MCV: 92 fL (ref 79–97)
Monocytes Absolute: 0.4 10*3/uL (ref 0.1–0.9)
Monocytes: 9 %
Neutrophils Absolute: 1.6 10*3/uL (ref 1.4–7.0)
Neutrophils: 33 %
Platelets: 216 10*3/uL (ref 150–450)
RBC: 4.56 x10E6/uL (ref 4.14–5.80)
RDW: 12.7 % (ref 11.6–15.4)
WBC: 4.7 10*3/uL (ref 3.4–10.8)

## 2018-08-18 LAB — COMPREHENSIVE METABOLIC PANEL
ALT: 19 IU/L (ref 0–44)
AST: 13 IU/L (ref 0–40)
Albumin/Globulin Ratio: 1.8 (ref 1.2–2.2)
Albumin: 4.6 g/dL (ref 4.0–5.0)
Alkaline Phosphatase: 80 IU/L (ref 39–117)
BUN/Creatinine Ratio: 13 (ref 9–20)
BUN: 14 mg/dL (ref 6–24)
Bilirubin Total: 0.4 mg/dL (ref 0.0–1.2)
CO2: 24 mmol/L (ref 20–29)
Calcium: 9.6 mg/dL (ref 8.7–10.2)
Chloride: 103 mmol/L (ref 96–106)
Creatinine, Ser: 1.07 mg/dL (ref 0.76–1.27)
GFR calc Af Amer: 98 mL/min/{1.73_m2} (ref >59)
GFR calc non Af Amer: 85 mL/min/{1.73_m2} (ref >59)
Globulin, Total: 2.6 g/dL (ref 1.5–4.5)
Glucose: 100 mg/dL — ABNORMAL HIGH (ref 65–99)
Potassium: 4.5 mmol/L (ref 3.5–5.2)
Sodium: 142 mmol/L (ref 134–144)
Total Protein: 7.2 g/dL (ref 6.0–8.5)

## 2018-08-18 LAB — LIPID PANEL
Chol/HDL Ratio: 6.7 ratio — ABNORMAL HIGH (ref 0.0–5.0)
Cholesterol, Total: 214 mg/dL — ABNORMAL HIGH (ref 100–199)
HDL: 32 mg/dL — ABNORMAL LOW (ref >39)
LDL Calculated: 109 mg/dL — ABNORMAL HIGH (ref 0–99)
Triglycerides: 367 mg/dL — ABNORMAL HIGH (ref 0–149)
VLDL Cholesterol Cal: 73 mg/dL — ABNORMAL HIGH (ref 5–40)

## 2018-11-14 ENCOUNTER — Other Ambulatory Visit: Payer: Self-pay | Admitting: Family Medicine

## 2018-11-14 DIAGNOSIS — I10 Essential (primary) hypertension: Secondary | ICD-10-CM

## 2018-11-15 ENCOUNTER — Other Ambulatory Visit: Payer: Self-pay | Admitting: Family Medicine

## 2018-11-15 DIAGNOSIS — I10 Essential (primary) hypertension: Secondary | ICD-10-CM

## 2019-02-08 ENCOUNTER — Other Ambulatory Visit: Payer: Self-pay | Admitting: Family Medicine

## 2019-02-08 DIAGNOSIS — I10 Essential (primary) hypertension: Secondary | ICD-10-CM

## 2019-05-08 ENCOUNTER — Other Ambulatory Visit: Payer: Self-pay | Admitting: Family Medicine

## 2019-05-08 DIAGNOSIS — I10 Essential (primary) hypertension: Secondary | ICD-10-CM

## 2019-05-08 NOTE — Telephone Encounter (Signed)
LVM advising pt to call and schedule an appt for or med check. Med will be fill for 30 days . [place]

## 2019-05-08 NOTE — Telephone Encounter (Signed)
LVM for advising the need or an appt . 30 days was sent in . [facility]

## 2019-05-30 ENCOUNTER — Other Ambulatory Visit: Payer: Self-pay | Admitting: Family Medicine

## 2019-05-30 DIAGNOSIS — I10 Essential (primary) hypertension: Secondary | ICD-10-CM

## 2019-06-06 ENCOUNTER — Other Ambulatory Visit: Payer: Self-pay | Admitting: Family Medicine

## 2019-06-06 DIAGNOSIS — I10 Essential (primary) hypertension: Secondary | ICD-10-CM

## 2019-07-02 ENCOUNTER — Other Ambulatory Visit: Payer: Self-pay | Admitting: Family Medicine

## 2019-07-02 DIAGNOSIS — I10 Essential (primary) hypertension: Secondary | ICD-10-CM

## 2019-07-03 ENCOUNTER — Other Ambulatory Visit: Payer: Self-pay

## 2019-07-03 ENCOUNTER — Telehealth: Payer: Self-pay | Admitting: Family Medicine

## 2019-07-03 DIAGNOSIS — I10 Essential (primary) hypertension: Secondary | ICD-10-CM

## 2019-07-03 MED ORDER — LOSARTAN POTASSIUM 100 MG PO TABS: 100.0000 mg | ORAL_TABLET | Freq: Every day | ORAL | 2 refills | Status: DC

## 2019-07-03 NOTE — Telephone Encounter (Signed)
[facility].  Pt states that he needs refills send in for Lorsartan to a different pharmacy. He states last time he pick up rx they only gave him 30 and now he is running out. [facility] [place]. Pt cna be reached at [phone].

## 2019-07-03 NOTE — Telephone Encounter (Signed)
Done and pt was advised med were sent. Derrick Garcia

## 2019-08-17 ENCOUNTER — Ambulatory Visit (INDEPENDENT_AMBULATORY_CARE_PROVIDER_SITE_OTHER): Payer: BC Managed Care – PPO | Admitting: Family Medicine

## 2019-08-17 ENCOUNTER — Other Ambulatory Visit: Payer: Self-pay

## 2019-08-17 ENCOUNTER — Encounter: Payer: Self-pay | Admitting: Family Medicine

## 2019-08-17 VITALS — BP 140/94 | HR 74 | Temp 98.5°F | Ht 67.25 in | Wt 196.6 lb

## 2019-08-17 DIAGNOSIS — Z0000 Encounter for general adult medical examination without abnormal findings: Secondary | ICD-10-CM

## 2019-08-17 DIAGNOSIS — I10 Essential (primary) hypertension: Secondary | ICD-10-CM

## 2019-08-17 DIAGNOSIS — J4520 Mild intermittent asthma, uncomplicated: Secondary | ICD-10-CM

## 2019-08-17 DIAGNOSIS — E785 Hyperlipidemia, unspecified: Secondary | ICD-10-CM

## 2019-08-17 DIAGNOSIS — J301 Allergic rhinitis due to pollen: Secondary | ICD-10-CM | POA: Diagnosis not present

## 2019-08-17 DIAGNOSIS — Z23 Encounter for immunization: Secondary | ICD-10-CM | POA: Diagnosis not present

## 2019-08-17 DIAGNOSIS — Z8719 Personal history of other diseases of the digestive system: Secondary | ICD-10-CM

## 2019-08-17 MED ORDER — ATENOLOL 25 MG PO TABS: 25.0000 mg | ORAL_TABLET | Freq: Every day | ORAL | 3 refills | Status: DC

## 2019-08-17 MED ORDER — LOSARTAN POTASSIUM 100 MG PO TABS: 100.0000 mg | ORAL_TABLET | Freq: Every day | ORAL | 3 refills | Status: DC

## 2019-08-17 NOTE — Patient Instructions (Signed)
Check your blood pressure at home and the resting sitting position with your arm at heart level and I want the number below 130/80.

## 2019-08-17 NOTE — Progress Notes (Signed)
   Subjective:    Patient ID: Derrick Garcia, male    DOB: 28-Feb-1975, 44 y.o.   MRN: [id number]  HPI He is here for complete examination.  He does have underlying hypertension and continues on losartan and atenolol without difficulty.  He rarely uses he is albuterol.  Does have a history of hyperlipidemia but not on any medications.  He also has a history of diverticulitis and has had a previous colonoscopy because of that.  He does have seasonal allergic rhinitis and uses OTC meds for control of this.  His work and home life are going well.  He has started back into an exercise program and does plan on making some dietary changes.  He has no other concerns or complaints.   Review of Systems  All other systems reviewed and are negative.      Objective:   Physical Exam Alert and in no distress. Tympanic membranes and canals are normal. Pharyngeal area is normal. Neck is supple without adenopathy or thyromegaly. Cardiac exam shows a regular sinus rhythm without murmurs or gallops. Lungs are clear to auscultation.  Abdominal exam shows no masses or tenderness with normal bowel sounds.        Assessment & Plan:  Routine general medical examination at a health care facility - Plan: Comprehensive metabolic panel, CBC with Differential/Platelet, Lipid panel  Essential hypertension - Plan: Comprehensive metabolic panel, CBC with Differential/Platelet, atenolol (TENORMIN) 25 MG tablet, losartan (COZAAR) 100 MG tablet  Seasonal allergic rhinitis due to pollen  Hyperlipidemia, unspecified hyperlipidemia type - Plan: Lipid panel  History of diverticulitis of colon  Mild intermittent extrinsic asthma without complication  Need for Tdap vaccination - Plan: Tdap vaccine greater than or equal to 7yo IM Continue on her present medication regimen as well as making changes in diet and exercise.  Continue to treat the allergies on an as-needed basis.  He will get a follow-up colonoscopy at the  appropriate time.  He is also let me know if his blood pressure stays above 130/80.

## 2019-08-18 LAB — CBC WITH DIFFERENTIAL/PLATELET
Basophils Absolute: 0 10*3/uL (ref 0.0–0.2)
Basos: 1 %
EOS (ABSOLUTE): 0.1 10*3/uL (ref 0.0–0.4)
Eos: 2 %
Hematocrit: 43 % (ref 37.5–51.0)
Hemoglobin: 15.1 g/dL (ref 13.0–17.7)
Immature Grans (Abs): 0 10*3/uL (ref 0.0–0.1)
Immature Granulocytes: 0 %
Lymphocytes Absolute: 2.1 10*3/uL (ref 0.7–3.1)
Lymphs: 47 %
MCH: 31.5 pg (ref 26.6–33.0)
MCHC: 35.1 g/dL (ref 31.5–35.7)
MCV: 90 fL (ref 79–97)
Monocytes Absolute: 0.3 10*3/uL (ref 0.1–0.9)
Monocytes: 8 %
Neutrophils Absolute: 1.9 10*3/uL (ref 1.4–7.0)
Neutrophils: 42 %
Platelets: 204 10*3/uL (ref 150–450)
RBC: 4.8 x10E6/uL (ref 4.14–5.80)
RDW: 12.8 % (ref 11.6–15.4)
WBC: 4.4 10*3/uL (ref 3.4–10.8)

## 2019-08-18 LAB — LIPID PANEL
Chol/HDL Ratio: 6.1 ratio — ABNORMAL HIGH (ref 0.0–5.0)
Cholesterol, Total: 200 mg/dL — ABNORMAL HIGH (ref 100–199)
HDL: 33 mg/dL — ABNORMAL LOW (ref >39)
LDL Chol Calc (NIH): 127 mg/dL — ABNORMAL HIGH (ref 0–99)
Triglycerides: 223 mg/dL — ABNORMAL HIGH (ref 0–149)
VLDL Cholesterol Cal: 40 mg/dL (ref 5–40)

## 2019-08-18 LAB — COMPREHENSIVE METABOLIC PANEL
ALT: 24 IU/L (ref 0–44)
AST: 16 IU/L (ref 0–40)
Albumin/Globulin Ratio: 1.6 (ref 1.2–2.2)
Albumin: 4.7 g/dL (ref 4.0–5.0)
Alkaline Phosphatase: 88 IU/L (ref 48–121)
BUN/Creatinine Ratio: 11 (ref 9–20)
BUN: 12 mg/dL (ref 6–24)
Bilirubin Total: 0.7 mg/dL (ref 0.0–1.2)
CO2: 24 mmol/L (ref 20–29)
Calcium: 9.7 mg/dL (ref 8.7–10.2)
Chloride: 103 mmol/L (ref 96–106)
Creatinine, Ser: 1.12 mg/dL (ref 0.76–1.27)
GFR calc Af Amer: 92 mL/min/{1.73_m2} (ref >59)
GFR calc non Af Amer: 79 mL/min/{1.73_m2} (ref >59)
Globulin, Total: 2.9 g/dL (ref 1.5–4.5)
Glucose: 102 mg/dL — ABNORMAL HIGH (ref 65–99)
Potassium: 4.1 mmol/L (ref 3.5–5.2)
Sodium: 139 mmol/L (ref 134–144)
Total Protein: 7.6 g/dL (ref 6.0–8.5)

## 2019-09-09 DIAGNOSIS — W57XXXA Bitten or stung by nonvenomous insect and other nonvenomous arthropods, initial encounter: Secondary | ICD-10-CM | POA: Diagnosis not present

## 2019-09-09 DIAGNOSIS — S80861A Insect bite (nonvenomous), right lower leg, initial encounter: Secondary | ICD-10-CM | POA: Diagnosis not present

## 2020-08-21 ENCOUNTER — Telehealth: Payer: Self-pay

## 2020-08-21 ENCOUNTER — Other Ambulatory Visit: Payer: Self-pay

## 2020-08-21 ENCOUNTER — Ambulatory Visit (INDEPENDENT_AMBULATORY_CARE_PROVIDER_SITE_OTHER): Payer: BC Managed Care – PPO | Admitting: Family Medicine

## 2020-08-21 ENCOUNTER — Encounter: Payer: Self-pay | Admitting: Family Medicine

## 2020-08-21 VITALS — BP 148/90 | HR 76 | Temp 97.4°F | Ht 67.5 in | Wt 196.0 lb

## 2020-08-21 DIAGNOSIS — Z1159 Encounter for screening for other viral diseases: Secondary | ICD-10-CM

## 2020-08-21 DIAGNOSIS — Z8719 Personal history of other diseases of the digestive system: Secondary | ICD-10-CM

## 2020-08-21 DIAGNOSIS — E785 Hyperlipidemia, unspecified: Secondary | ICD-10-CM | POA: Diagnosis not present

## 2020-08-21 DIAGNOSIS — Z0000 Encounter for general adult medical examination without abnormal findings: Secondary | ICD-10-CM | POA: Diagnosis not present

## 2020-08-21 DIAGNOSIS — J301 Allergic rhinitis due to pollen: Secondary | ICD-10-CM

## 2020-08-21 DIAGNOSIS — J4520 Mild intermittent asthma, uncomplicated: Secondary | ICD-10-CM | POA: Diagnosis not present

## 2020-08-21 DIAGNOSIS — I10 Essential (primary) hypertension: Secondary | ICD-10-CM | POA: Diagnosis not present

## 2020-08-21 DIAGNOSIS — Z1211 Encounter for screening for malignant neoplasm of colon: Secondary | ICD-10-CM

## 2020-08-21 MED ORDER — ATENOLOL 25 MG PO TABS: 25.0000 mg | ORAL_TABLET | Freq: Every day | ORAL | 3 refills | Status: DC

## 2020-08-21 MED ORDER — ALBUTEROL SULFATE HFA 108 (90 BASE) MCG/ACT IN AERS: 2.0000 | INHALATION_SPRAY | Freq: Four times a day (QID) | RESPIRATORY_TRACT | 0 refills | Status: DC | PRN

## 2020-08-21 MED ORDER — LOSARTAN POTASSIUM 100 MG PO TABS: 100.0000 mg | ORAL_TABLET | Freq: Every day | ORAL | 3 refills | Status: DC

## 2020-08-21 NOTE — Patient Instructions (Signed)
Use Compound W daily for about 3 days and then scrape it off on the fourth day and start the cycle every week

## 2020-08-21 NOTE — Telephone Encounter (Signed)
Pt advised per JCL to check B/P and record them and send them to [place] in about a month. [place]

## 2020-08-21 NOTE — Progress Notes (Signed)
   Subjective:    Patient ID: Derrick Garcia, male    DOB: July 25, 1975, 45 y.o.   MRN: [id number]  HPI He is here for complete examination.  He does have underlying allergies and has some under good control on his present medication regimen.  He does occasionally have difficulty with asthma with his allergies and does use an inhaler very rarely.  He has a previous history of diverticulitis and apparently did have a colonoscopy in 2007.  He states that he had a polyp but was not told to come back for a 5-year repeat.  He otherwise has no further concerns or complaints.  He does have underlying hypertension and continues on his same medication. Otherwise family and social history as well as health maintenance and immunizations was reviewed.  Review of Systems  All other systems reviewed and are negative.     Objective:   Physical Exam Alert and in no distress. Tympanic membranes and canals are normal. Pharyngeal area is normal. Neck is supple without adenopathy or thyromegaly. Cardiac exam shows a regular sinus rhythm without murmurs or gallops. Lungs are clear to auscultation. Abdominal exam shows normal bowel sounds without masses or tenderness.  Genitalia normal circumcised male       Assessment & Plan:  Seasonal allergic rhinitis due to pollen  Mild intermittent extrinsic asthma without complication - Plan: albuterol (VENTOLIN HFA) 108 (90 Base) MCG/ACT inhaler  Essential hypertension - Plan: atenolol (TENORMIN) 25 MG tablet, losartan (COZAAR) 100 MG tablet  Hyperlipidemia, unspecified hyperlipidemia type - Plan: Lipid panel  History of diverticulitis of colon  Routine general medical examination at a health care facility - Plan: CBC with Differential/Platelet, Comprehensive metabolic panel, Lipid panel  Screening for colon cancer - Plan: Cologuard  Need for hepatitis C screening test - Plan: Hepatitis C antibody Discussed diet and exercise with him in regard to cutting back on  carbohydrates.  He is also to check his blood pressure at home and call us with the readings.  Apparently his cuff at home is accurate.

## 2020-08-22 LAB — HEPATITIS C ANTIBODY: Hep C Virus Ab: 0.1 s/co ratio (ref 0.0–0.9)

## 2020-08-22 LAB — CBC WITH DIFFERENTIAL/PLATELET
Basophils Absolute: 0 10*3/uL (ref 0.0–0.2)
Basos: 1 %
EOS (ABSOLUTE): 0.1 10*3/uL (ref 0.0–0.4)
Eos: 3 %
Hematocrit: 46.2 % (ref 37.5–51.0)
Hemoglobin: 15.3 g/dL (ref 13.0–17.7)
Immature Grans (Abs): 0 10*3/uL (ref 0.0–0.1)
Immature Granulocytes: 0 %
Lymphocytes Absolute: 2 10*3/uL (ref 0.7–3.1)
Lymphs: 45 %
MCH: 30.1 pg (ref 26.6–33.0)
MCHC: 33.1 g/dL (ref 31.5–35.7)
MCV: 91 fL (ref 79–97)
Monocytes Absolute: 0.4 10*3/uL (ref 0.1–0.9)
Monocytes: 9 %
Neutrophils Absolute: 1.9 10*3/uL (ref 1.4–7.0)
Neutrophils: 42 %
Platelets: 212 10*3/uL (ref 150–450)
RBC: 5.09 x10E6/uL (ref 4.14–5.80)
RDW: 13 % (ref 11.6–15.4)
WBC: 4.4 10*3/uL (ref 3.4–10.8)

## 2020-08-22 LAB — COMPREHENSIVE METABOLIC PANEL
ALT: 18 IU/L (ref 0–44)
AST: 23 IU/L (ref 0–40)
Albumin/Globulin Ratio: 1.7 (ref 1.2–2.2)
Albumin: 5 g/dL (ref 4.0–5.0)
Alkaline Phosphatase: 85 IU/L (ref 44–121)
BUN/Creatinine Ratio: 14 (ref 9–20)
BUN: 17 mg/dL (ref 6–24)
Bilirubin Total: 0.9 mg/dL (ref 0.0–1.2)
CO2: 23 mmol/L (ref 20–29)
Calcium: 9.9 mg/dL (ref 8.7–10.2)
Chloride: 97 mmol/L (ref 96–106)
Creatinine, Ser: 1.21 mg/dL (ref 0.76–1.27)
Globulin, Total: 2.9 g/dL (ref 1.5–4.5)
Glucose: 97 mg/dL (ref 65–99)
Potassium: 4.3 mmol/L (ref 3.5–5.2)
Sodium: 135 mmol/L (ref 134–144)
Total Protein: 7.9 g/dL (ref 6.0–8.5)
eGFR: 75 mL/min/{1.73_m2} (ref >59)

## 2020-08-22 LAB — LIPID PANEL
Chol/HDL Ratio: 6.4 ratio — ABNORMAL HIGH (ref 0.0–5.0)
Cholesterol, Total: 245 mg/dL — ABNORMAL HIGH (ref 100–199)
HDL: 38 mg/dL — ABNORMAL LOW (ref >39)
LDL Chol Calc (NIH): 142 mg/dL — ABNORMAL HIGH (ref 0–99)
Triglycerides: 355 mg/dL — ABNORMAL HIGH (ref 0–149)
VLDL Cholesterol Cal: 65 mg/dL — ABNORMAL HIGH (ref 5–40)

## 2020-08-26 ENCOUNTER — Encounter: Payer: BC Managed Care – PPO | Admitting: Family Medicine

## 2020-09-02 DIAGNOSIS — Z1211 Encounter for screening for malignant neoplasm of colon: Secondary | ICD-10-CM | POA: Diagnosis not present

## 2020-09-04 ENCOUNTER — Encounter: Payer: BC Managed Care – PPO | Admitting: Family Medicine

## 2020-09-05 ENCOUNTER — Encounter: Payer: Self-pay | Admitting: Family Medicine

## 2020-09-05 LAB — COLOGUARD
COLOGUARD: NEGATIVE
Cologuard: NEGATIVE

## 2021-08-22 ENCOUNTER — Encounter: Payer: Self-pay | Admitting: Family Medicine

## 2021-08-22 ENCOUNTER — Ambulatory Visit (INDEPENDENT_AMBULATORY_CARE_PROVIDER_SITE_OTHER): Payer: BC Managed Care – PPO | Admitting: Family Medicine

## 2021-08-22 VITALS — BP 152/100 | HR 71 | Temp 97.7°F | Ht 67.0 in | Wt 198.2 lb

## 2021-08-22 DIAGNOSIS — J4520 Mild intermittent asthma, uncomplicated: Secondary | ICD-10-CM

## 2021-08-22 DIAGNOSIS — Z0000 Encounter for general adult medical examination without abnormal findings: Secondary | ICD-10-CM

## 2021-08-22 DIAGNOSIS — I10 Essential (primary) hypertension: Secondary | ICD-10-CM

## 2021-08-22 DIAGNOSIS — E785 Hyperlipidemia, unspecified: Secondary | ICD-10-CM

## 2021-08-22 DIAGNOSIS — J301 Allergic rhinitis due to pollen: Secondary | ICD-10-CM | POA: Diagnosis not present

## 2021-08-22 MED ORDER — LOSARTAN POTASSIUM 100 MG PO TABS: 100.0000 mg | ORAL_TABLET | Freq: Every day | ORAL | 3 refills | Status: DC

## 2021-08-22 MED ORDER — ATENOLOL 25 MG PO TABS: 25.0000 mg | ORAL_TABLET | Freq: Every day | ORAL | 3 refills | Status: DC

## 2021-08-22 NOTE — Progress Notes (Signed)
Complete physical exam  Patient: Derrick Garcia   DOB: December 17, 1975   46 y.o. Male  MRN: [id number]  Subjective:    Chief Complaint  Patient presents with   Annual Exam    Fasting     Derrick Garcia is a 46 y.o. male who presents today for a complete physical exam. He reports consuming a general diet. Gym/ health club routine includes mod to heavy weightlifting and walking on track . He generally feels well. He reports sleeping fairly well. He is starting to have some slight difficulty with erectile dysfunction.  So far has not been a real problem with him or his wife.  He continues on his blood pressure medicine without difficulty.  Allergies usually affect him mainly in the spring.  He does occasionally use Ventolin but has been quite sometime since he is needed that..  Blood work does show some elevated lipids.  Otherwise his family and social history as well as health maintenance and immunizations was reviewed   Most recent fall risk assessment:    04/27/2016   10:04 AM  [place] in the past year? No     Most recent depression screenings:    08/22/2021    9:26 AM 08/21/2020    9:31 AM  PHQ 2/9 Scores  PHQ - 2 Score 0 0      Patient Active Problem List   Diagnosis Date Noted   Extrinsic asthma 04/27/2016   Seasonal allergic rhinitis due to pollen 04/27/2016   History of diverticulitis of colon 05/23/2012   Hypertension 06/30/2010   Hyperlipidemia 06/30/2010   Past Medical History:  Diagnosis Date   Allergy    Asthma    Colon polyps    Diverticulitis September 2008   Hospitalization   Hyperlipidemia    Hypertension    Past Surgical History:  Procedure Laterality Date   COLONOSCOPY W/ BIOPSIES     FINGER SURGERY Right    broken pinky finger   VASECTOMY     Social History   Tobacco Use   Smoking status: Never   Smokeless tobacco: Never  Substance Use Topics   Alcohol use: Yes    Alcohol/week: 7.0 standard drinks of alcohol    Types: 7  Standard drinks or equivalent per week    Comment: socially   Drug use: No   Family History  Problem Relation Age of Onset   Hypertension Mother    Hypertension Father    Heart disease Brother        Data as a child from a hole in the heart   Allergies  Allergen Reactions   Ciprocin-Fluocin-Procin [Fluocinolone Acetonide] Hives   Ciprofloxacin    Penicillins Other (See Comments)    Unknown childhood reaction      Patient Care Team: Denita Lung, MD as PCP - General (Family Medicine)   Outpatient Medications Prior to Visit  Medication Sig Note   albuterol (VENTOLIN HFA) 108 (90 Base) MCG/ACT inhaler Inhale 2 puffs into the lungs every 6 (six) hours as needed for wheezing or shortness of breath. Reported on 08/22/2015 08/22/2021: Prn last dose over six months ago   [DISCONTINUED] atenolol (TENORMIN) 25 MG tablet Take 1 tablet (25 mg total) by mouth daily.    [DISCONTINUED] losartan (COZAAR) 100 MG tablet Take 1 tablet (100 mg total) by mouth daily.    No facility-administered medications prior to visit.    Review of Systems  All other systems reviewed and are negative.  Objective:       BP (!) 150/100   Pulse 71   Temp 97.7 F (36.5 C)   Ht _0  (1.702 m)   Wt 198 lb 3.2 oz (89.9 kg)   SpO2 98%   BMI 31.04 kg/m  BP Readings from Last 3 Encounters:  08/22/21 (!) 150/100  08/21/20 (!) 148/90  08/17/19 (!) 140/94   Wt Readings from Last 3 Encounters:  08/22/21 198 lb 3.2 oz (89.9 kg)  08/21/20 196 lb (88.9 kg)  08/17/19 196 lb 9.6 oz (89.2 kg)      Physical Exam  Alert and in no distress. Tympanic membranes and canals are normal. Pharyngeal area is normal. Neck is supple without adenopathy or thyromegaly. Cardiac exam shows a regular sinus rhythm without murmurs or gallops. Lungs are clear to auscultation. Abdominal exam shows no masses or tenderness with normal bowel sounds Last CBC Lab Results  Component Value Date   WBC 4.4 08/21/2020    HGB 15.3 08/21/2020   HCT 46.2 08/21/2020   MCV 91 08/21/2020   MCH 30.1 08/21/2020   RDW 13.0 08/21/2020   PLT 212 79/89/2119   Last metabolic panel Lab Results  Component Value Date   GLUCOSE 97 08/21/2020   NA 135 08/21/2020   K 4.3 08/21/2020   CL 97 08/21/2020   CO2 23 08/21/2020   BUN 17 08/21/2020   CREATININE 1.21 08/21/2020   EGFR 75 08/21/2020   CALCIUM 9.9 08/21/2020   PROT 7.9 08/21/2020   ALBUMIN 5.0 08/21/2020   LABGLOB 2.9 08/21/2020   AGRATIO 1.7 08/21/2020   BILITOT 0.9 08/21/2020   ALKPHOS 85 08/21/2020   AST 23 08/21/2020   ALT 18 08/21/2020   Last lipids Lab Results  Component Value Date   CHOL 245 (H) 08/21/2020   HDL 38 (L) 08/21/2020   LDLCALC 142 (H) 08/21/2020   TRIG 355 (H) 08/21/2020   CHOLHDL 6.4 (H) 08/21/2020        Assessment & Plan:    Routine general medical examination at a health care facility - Plan: CBC with Differential/Platelet, Comprehensive metabolic panel, Lipid panel  Mild intermittent extrinsic asthma without complication  Essential hypertension - [place] hypertension - Plan: CBC with Differential/Platelet, Comprehensive metabolic panel, losartan (COZAAR) 100 MG tablet, atenolol (TENORMIN) 25 MG tablet  Seasonal allergic rhinitis due to pollen  Hyperlipidemia, unspecified hyperlipidemia type - Plan: Lipid panel   Immunization History  Administered Date(s) Administered   Influenza,inj,Quad PF,6+ Mos 11/18/2012   PFIZER(Purple Top)SARS-COV-2 Vaccination 05/12/2019, 06/02/2019   Tdap 08/17/2019    Health Maintenance  Topic Date Due   COVID-19 Vaccine (3 - Pfizer series) 09/07/2021 (Originally 07/28/2019)   HIV Screening  08/23/2022 (Originally 01/05/1990)   INFLUENZA VACCINE  09/16/2021   Fecal DNA (Cologuard)  09/06/2023   TETANUS/TDAP  08/16/2029   Hepatitis C Screening  Completed   HPV VACCINES  Aged Out   COLONOSCOPY (Pts 45-21yr Insurance coverage will need to be confirmed)  Discontinued     Discussed health benefits of physical activity, and encouraged him to engage in regular exercise appropriate for his age and condition.  I also discussed the fact that he is now having intermittent erectile dysfunction and when this becomes more of an issue, he is to call me.  Discussed the use of ED medication.  Also recommend using nasal steroid spray when his allergies give him trouble.  Continue on his present medication regimen.  Problem List Items Addressed This Visit     Extrinsic  asthma   Hyperlipidemia   Relevant Medications   losartan (COZAAR) 100 MG tablet   atenolol (TENORMIN) 25 MG tablet   Other Relevant Orders   Lipid panel   Seasonal allergic rhinitis due to pollen   Other Visit Diagnoses     Routine general medical examination at a health care facility    -  Primary   Relevant Orders   CBC with Differential/Platelet   Comprehensive metabolic panel   Lipid panel   Essential hypertension       Relevant Medications   losartan (COZAAR) 100 MG tablet   atenolol (TENORMIN) 25 MG tablet   Other Relevant Orders   CBC with Differential/Platelet   Comprehensive metabolic panel      Return in about 1 year (around 08/23/2022) for fasting cpe .     Jill Alexanders, MD

## 2021-08-22 NOTE — Patient Instructions (Signed)
Health Maintenance, Male Adopting a healthy lifestyle and getting preventive care are important in promoting health and wellness. Ask your health care provider about: The right schedule for you to have regular tests and exams. Things you can do on your own to prevent diseases and keep yourself healthy. What should I know about diet, weight, and exercise? Eat a healthy diet  Eat a diet that includes plenty of vegetables, fruits, low-fat dairy products, and lean protein. Do not eat a lot of foods that are high in solid fats, added sugars, or sodium. Maintain a healthy weight Body mass index (BMI) is a measurement that can be used to identify possible weight problems. It estimates body fat based on height and weight. Your health care provider can help determine your BMI and help you achieve or maintain a healthy weight. Get regular exercise Get regular exercise. This is one of the most important things you can do for your health. Most adults should: Exercise for at least 150 minutes each week. The exercise should increase your heart rate and make you sweat (moderate-intensity exercise). Do strengthening exercises at least twice a week. This is in addition to the moderate-intensity exercise. Spend less time sitting. Even light physical activity can be beneficial. Watch cholesterol and blood lipids Have your blood tested for lipids and cholesterol at 47 years of age, then have this test every 5 years. You may need to have your cholesterol levels checked more often if: Your lipid or cholesterol levels are high. You are older than 47 years of age. You are at high risk for heart disease. What should I know about cancer screening? Many types of cancers can be detected early and may often be prevented. Depending on your health history and family history, you may need to have cancer screening at various ages. This may include screening for: Colorectal cancer. Prostate cancer. Skin cancer. Lung  cancer. What should I know about heart disease, diabetes, and high blood pressure? Blood pressure and heart disease High blood pressure causes heart disease and increases the risk of stroke. This is more likely to develop in people who have high blood pressure readings or are overweight. Talk with your health care provider about your target blood pressure readings. Have your blood pressure checked: Every 3-5 years if you are 18-39 years of age. Every year if you are 40 years old or older. If you are between the ages of 65 and 75 and are a current or former smoker, ask your health care provider if you should have a one-time screening for abdominal aortic aneurysm (AAA). Diabetes Have regular diabetes screenings. This checks your fasting blood sugar level. Have the screening done: Once every three years after age 45 if you are at a normal weight and have a low risk for diabetes. More often and at a younger age if you are overweight or have a high risk for diabetes. What should I know about preventing infection? Hepatitis B If you have a higher risk for hepatitis B, you should be screened for this virus. Talk with your health care provider to find out if you are at risk for hepatitis B infection. Hepatitis C Blood testing is recommended for: Everyone born from 1945 through 1965. Anyone with known risk factors for hepatitis C. Sexually transmitted infections (STIs) You should be screened each year for STIs, including gonorrhea and chlamydia, if: You are sexually active and are younger than 47 years of age. You are older than 47 years of age and your   health care provider tells you that you are at risk for this type of infection. Your sexual activity has changed since you were last screened, and you are at increased risk for chlamydia or gonorrhea. Ask your health care provider if you are at risk. Ask your health care provider about whether you are at high risk for HIV. Your health care provider  may recommend a prescription medicine to help prevent HIV infection. If you choose to take medicine to prevent HIV, you should first get tested for HIV. You should then be tested every 3 months for as long as you are taking the medicine. Follow these instructions at home: Alcohol use Do not drink alcohol if your health care provider tells you not to drink. If you drink alcohol: Limit how much you have to 0-2 drinks a day. Know how much alcohol is in your drink. In the [place] one drink equals one 12 oz bottle of beer (355 mL), one 5 oz glass of wine (148 mL), or one 1 oz glass of hard liquor (44 mL). Lifestyle Do not use any products that contain nicotine or tobacco. These products include cigarettes, chewing tobacco, and vaping devices, such as e-cigarettes. If you need help quitting, ask your health care provider. Do not use street drugs. Do not share needles. Ask your health care provider for help if you need support or information about quitting drugs. General instructions Schedule regular health, dental, and eye exams. Stay current with your vaccines. Tell your health care provider if: You often feel depressed. You have ever been abused or do not feel safe at home. Summary Adopting a healthy lifestyle and getting preventive care are important in promoting health and wellness. Follow your health care provider's instructions about healthy diet, exercising, and getting tested or screened for diseases. Follow your health care provider's instructions on monitoring your cholesterol and blood pressure. This information is not intended to replace advice given to you by your health care provider. Make sure you discuss any questions you have with your health care provider. Document Revised: 06/24/2020 Document Reviewed: 06/24/2020 Elsevier Patient Education  2023 [employer]  

## 2021-08-23 LAB — COMPREHENSIVE METABOLIC PANEL
ALT: 17 IU/L (ref 0–44)
AST: 16 IU/L (ref 0–40)
Albumin/Globulin Ratio: 1.6 (ref 1.2–2.2)
Albumin: 4.8 g/dL (ref 4.0–5.0)
Alkaline Phosphatase: 87 IU/L (ref 44–121)
BUN/Creatinine Ratio: 13 (ref 9–20)
BUN: 16 mg/dL (ref 6–24)
Bilirubin Total: 0.4 mg/dL (ref 0.0–1.2)
CO2: 21 mmol/L (ref 20–29)
Calcium: 9.9 mg/dL (ref 8.7–10.2)
Chloride: 102 mmol/L (ref 96–106)
Creatinine, Ser: 1.21 mg/dL (ref 0.76–1.27)
Globulin, Total: 3 g/dL (ref 1.5–4.5)
Glucose: 112 mg/dL — ABNORMAL HIGH (ref 70–99)
Potassium: 4.6 mmol/L (ref 3.5–5.2)
Sodium: 140 mmol/L (ref 134–144)
Total Protein: 7.8 g/dL (ref 6.0–8.5)
eGFR: 75 mL/min/{1.73_m2} (ref >59)

## 2021-08-23 LAB — CBC WITH DIFFERENTIAL/PLATELET
Basophils Absolute: 0.1 10*3/uL (ref 0.0–0.2)
Basos: 1 %
EOS (ABSOLUTE): 0.1 10*3/uL (ref 0.0–0.4)
Eos: 2 %
Hematocrit: 43.9 % (ref 37.5–51.0)
Hemoglobin: 14.7 g/dL (ref 13.0–17.7)
Immature Grans (Abs): 0 10*3/uL (ref 0.0–0.1)
Immature Granulocytes: 0 %
Lymphocytes Absolute: 2.3 10*3/uL (ref 0.7–3.1)
Lymphs: 48 %
MCH: 30.6 pg (ref 26.6–33.0)
MCHC: 33.5 g/dL (ref 31.5–35.7)
MCV: 91 fL (ref 79–97)
Monocytes Absolute: 0.4 10*3/uL (ref 0.1–0.9)
Monocytes: 8 %
Neutrophils Absolute: 2 10*3/uL (ref 1.4–7.0)
Neutrophils: 41 %
Platelets: 202 10*3/uL (ref 150–450)
RBC: 4.81 x10E6/uL (ref 4.14–5.80)
RDW: 12.6 % (ref 11.6–15.4)
WBC: 4.8 10*3/uL (ref 3.4–10.8)

## 2021-08-23 LAB — LIPID PANEL
Chol/HDL Ratio: 6.4 ratio — ABNORMAL HIGH (ref 0.0–5.0)
Cholesterol, Total: 237 mg/dL — ABNORMAL HIGH (ref 100–199)
HDL: 37 mg/dL — ABNORMAL LOW (ref >39)
LDL Chol Calc (NIH): 142 mg/dL — ABNORMAL HIGH (ref 0–99)
Triglycerides: 316 mg/dL — ABNORMAL HIGH (ref 0–149)
VLDL Cholesterol Cal: 58 mg/dL — ABNORMAL HIGH (ref 5–40)

## 2021-08-27 LAB — HGB A1C W/O EAG: Hgb A1c MFr Bld: 5.7 % — ABNORMAL HIGH (ref 4.8–5.6)

## 2021-08-27 LAB — SPECIMEN STATUS REPORT

## 2021-10-22 ENCOUNTER — Encounter: Payer: Self-pay | Admitting: Internal Medicine

## 2021-11-25 ENCOUNTER — Encounter: Payer: Self-pay | Admitting: Internal Medicine

## 2021-12-08 ENCOUNTER — Encounter: Payer: Self-pay | Admitting: Internal Medicine

## 2022-08-26 ENCOUNTER — Encounter: Payer: BC Managed Care – PPO | Admitting: Family Medicine

## 2022-09-24 ENCOUNTER — Ambulatory Visit (INDEPENDENT_AMBULATORY_CARE_PROVIDER_SITE_OTHER): Payer: BC Managed Care – PPO | Admitting: Family Medicine

## 2022-09-24 ENCOUNTER — Encounter: Payer: Self-pay | Admitting: Family Medicine

## 2022-09-24 VITALS — BP 144/92 | HR 71 | Ht 67.75 in | Wt 195.8 lb

## 2022-09-24 DIAGNOSIS — J301 Allergic rhinitis due to pollen: Secondary | ICD-10-CM

## 2022-09-24 DIAGNOSIS — J4520 Mild intermittent asthma, uncomplicated: Secondary | ICD-10-CM | POA: Diagnosis not present

## 2022-09-24 DIAGNOSIS — E785 Hyperlipidemia, unspecified: Secondary | ICD-10-CM

## 2022-09-24 DIAGNOSIS — Z8719 Personal history of other diseases of the digestive system: Secondary | ICD-10-CM

## 2022-09-24 DIAGNOSIS — I10 Essential (primary) hypertension: Secondary | ICD-10-CM | POA: Diagnosis not present

## 2022-09-24 DIAGNOSIS — Z0000 Encounter for general adult medical examination without abnormal findings: Secondary | ICD-10-CM | POA: Diagnosis not present

## 2022-09-24 LAB — CBC WITH DIFFERENTIAL/PLATELET
Basophils Absolute: 0 10*3/uL (ref 0.0–0.2)
Basos: 1 %
EOS (ABSOLUTE): 0.2 10*3/uL (ref 0.0–0.4)
Eos: 5 %
Hematocrit: 41.1 % (ref 37.5–51.0)
Hemoglobin: 13.8 g/dL (ref 13.0–17.7)
Immature Grans (Abs): 0 10*3/uL (ref 0.0–0.1)
Immature Granulocytes: 0 %
Lymphocytes Absolute: 1.7 10*3/uL (ref 0.7–3.1)
Lymphs: 40 %
MCH: 30 pg (ref 26.6–33.0)
MCHC: 33.6 g/dL (ref 31.5–35.7)
MCV: 89 fL (ref 79–97)
Monocytes Absolute: 0.3 10*3/uL (ref 0.1–0.9)
Monocytes: 7 %
Neutrophils Absolute: 1.9 10*3/uL (ref 1.4–7.0)
Neutrophils: 47 %
Platelets: 186 10*3/uL (ref 150–450)
RBC: 4.6 x10E6/uL (ref 4.14–5.80)
RDW: 12.7 % (ref 11.6–15.4)
WBC: 4.2 10*3/uL (ref 3.4–10.8)

## 2022-09-24 LAB — COMPREHENSIVE METABOLIC PANEL
ALT: 24 IU/L (ref 0–44)
AST: 17 IU/L (ref 0–40)
Albumin: 4.6 g/dL (ref 4.1–5.1)
Alkaline Phosphatase: 82 IU/L (ref 44–121)
BUN/Creatinine Ratio: 9 (ref 9–20)
BUN: 13 mg/dL (ref 6–24)
Bilirubin Total: 0.4 mg/dL (ref 0.0–1.2)
CO2: 23 mmol/L (ref 20–29)
Calcium: 9.7 mg/dL (ref 8.7–10.2)
Chloride: 102 mmol/L (ref 96–106)
Creatinine, Ser: 1.44 mg/dL — ABNORMAL HIGH (ref 0.76–1.27)
Globulin, Total: 2.7 g/dL (ref 1.5–4.5)
Glucose: 99 mg/dL (ref 70–99)
Potassium: 4.5 mmol/L (ref 3.5–5.2)
Sodium: 140 mmol/L (ref 134–144)
Total Protein: 7.3 g/dL (ref 6.0–8.5)
eGFR: 60 mL/min/{1.73_m2} (ref >59)

## 2022-09-24 LAB — LIPID PANEL

## 2022-09-24 MED ORDER — ALBUTEROL SULFATE HFA 108 (90 BASE) MCG/ACT IN AERS
2.0000 | INHALATION_SPRAY | Freq: Four times a day (QID) | RESPIRATORY_TRACT | 0 refills | Status: DC | PRN
Start: 2022-09-24 — End: 2023-10-10

## 2022-09-24 MED ORDER — LOSARTAN POTASSIUM 100 MG PO TABS
100.0000 mg | ORAL_TABLET | Freq: Every day | ORAL | 3 refills | Status: DC
Start: 2022-09-24 — End: 2023-10-10

## 2022-09-24 MED ORDER — ATENOLOL 25 MG PO TABS
25.0000 mg | ORAL_TABLET | Freq: Every day | ORAL | 3 refills | Status: DC
Start: 2022-09-24 — End: 2023-10-10

## 2022-09-24 NOTE — Progress Notes (Signed)
Complete physical exam  Patient: Derrick Garcia   DOB: 1975/10/27   47 y.o. Male  MRN: [id number]  Subjective:    Chief Complaint  Patient presents with   Annual Exam    CPE, fasting labs, no other issues, needs refill on inhaler to have on hand,     Derrick Garcia is a 47 y.o. male who presents today for a complete physical exam. He reports consuming a general diet. Gym/ health club routine includes light weights. He generally feels fairly well. He reports sleeping fairly well. He does not have additional problems to discuss today.  He does have seasonal allergies and uses Ventolin on an as-needed basis.  He continues on Cozaar and Tenormin.  He is having no difficulty with that.  He does have a history of diverticulitis and is aware of the problems there and plans to call me if there is any troubles.  He does have a history of hyperlipidemia however the family history is negative.He does not smoke and usually has 1 alcoholic beverage per night.  Work and home life are going well. Family and social history as well as health maintenance and immunizations was reviewed.  Most recent fall risk assessment:    09/24/2022    9:33 AM  Fall Risk   Falls in the past year? 0  Number falls in past yr: 0  Injury with Fall? 0  Risk for fall due to : No Fall Risks  Follow up Falls evaluation completed     Most recent depression screenings:    09/24/2022    9:33 AM 08/22/2021    9:26 AM  PHQ 2/9 Scores  PHQ - 2 Score 0 0        Patient Care Team: Ronnald Nian, MD as PCP - General (Family Medicine)   Outpatient Medications Prior to Visit  Medication Sig   [DISCONTINUED] albuterol (VENTOLIN HFA) 108 (90 Base) MCG/ACT inhaler Inhale 2 puffs into the lungs every 6 (six) hours as needed for wheezing or shortness of breath. Reported on 08/22/2015   [DISCONTINUED] atenolol (TENORMIN) 25 MG tablet Take 1 tablet (25 mg total) by mouth daily.   [DISCONTINUED] losartan (COZAAR) 100 MG tablet  Take 1 tablet (100 mg total) by mouth daily.   No facility-administered medications prior to visit.    Review of Systems  All other systems reviewed and are negative.         Objective:       Physical Exam   Alert and in no distress. Tympanic membranes and canals are normal. Pharyngeal area is normal. Neck is supple without adenopathy or thyromegaly. Cardiac exam shows a regular sinus rhythm without murmurs or gallops. Lungs are clear to auscultation.     Assessment & Plan:    Routine Health Maintenance and Physical Exam  Immunization History  Administered Date(s) Administered   Influenza,inj,Quad PF,6+ Mos 11/18/2012   PFIZER(Purple Top)SARS-COV-2 Vaccination 05/12/2019, 06/02/2019   Tdap 08/17/2019    Health Maintenance  Topic Date Due   HIV Screening  Never done   COVID-19 Vaccine (3 - 2023-24 season) 10/17/2021   INFLUENZA VACCINE  09/17/2022   Fecal DNA (Cologuard)  09/06/2023   DTaP/Tdap/Td (2 - Td or Tdap) 08/16/2029   Hepatitis C Screening  Completed   HPV VACCINES  Aged Out   Colonoscopy  Discontinued     Problem List Items Addressed This Visit     Extrinsic asthma   Relevant Medications   albuterol (VENTOLIN HFA) 108 (  90 Base) MCG/ACT inhaler   History of diverticulitis of colon   Hyperlipidemia   Relevant Medications   losartan (COZAAR) 100 MG tablet   atenolol (TENORMIN) 25 MG tablet   Other Relevant Orders   Lipid panel   Hypertension   Relevant Medications   losartan (COZAAR) 100 MG tablet   atenolol (TENORMIN) 25 MG tablet   Other Relevant Orders   CBC with Differential/Platelet   Comprehensive metabolic panel   Seasonal allergic rhinitis due to pollen   Other Visit Diagnoses     Routine general medical examination at a health care facility    -  Primary   Relevant Orders   CBC with Differential/Platelet   Comprehensive metabolic panel   Lipid panel     Discussed the need for flu and COVID shots.  Also discussed dietary  modification especially in lieu of his lipid panel.  Explained that eventually we might need to place him on medications. Follow-up 1 year    Sharlot Gowda, MD

## 2022-12-14 DIAGNOSIS — F419 Anxiety disorder, unspecified: Secondary | ICD-10-CM | POA: Diagnosis not present

## 2022-12-17 ENCOUNTER — Ambulatory Visit: Payer: BC Managed Care – PPO | Admitting: Family Medicine

## 2022-12-17 ENCOUNTER — Encounter: Payer: Self-pay | Admitting: Family Medicine

## 2022-12-17 VITALS — BP 142/80 | HR 70 | Wt 190.6 lb

## 2022-12-17 DIAGNOSIS — J301 Allergic rhinitis due to pollen: Secondary | ICD-10-CM | POA: Diagnosis not present

## 2022-12-17 DIAGNOSIS — L989 Disorder of the skin and subcutaneous tissue, unspecified: Secondary | ICD-10-CM | POA: Diagnosis not present

## 2022-12-17 NOTE — Progress Notes (Signed)
   Subjective:    Patient ID: Derrick Garcia, male    DOB: 07/17/1975, 47 y.o.   MRN: [id number]  HPI He does have a history of seasonal allergies and recently noted a rash near the left nostril.  It is slightly itchy and uncomfortable but not painful.  He has never had this before.   Review of Systems     Objective:    Physical Exam Exam of the left nose shows slightly erythematous almost vesicular type lesion at the nares.       Assessment & Plan:   Seasonal allergic rhinitis due to pollen  External nasal lesion Recommend he continue to treat his allergies as he has been and to use the Neosporin on this area.  Although it has some characteristics of cold sore I am reluctant to call it at this point.  I explained this to him.

## 2022-12-22 DIAGNOSIS — F419 Anxiety disorder, unspecified: Secondary | ICD-10-CM | POA: Diagnosis not present

## 2023-01-01 DIAGNOSIS — F419 Anxiety disorder, unspecified: Secondary | ICD-10-CM | POA: Diagnosis not present

## 2023-01-08 DIAGNOSIS — F419 Anxiety disorder, unspecified: Secondary | ICD-10-CM | POA: Diagnosis not present

## 2023-01-22 DIAGNOSIS — F419 Anxiety disorder, unspecified: Secondary | ICD-10-CM | POA: Diagnosis not present

## 2023-01-29 DIAGNOSIS — F419 Anxiety disorder, unspecified: Secondary | ICD-10-CM | POA: Diagnosis not present

## 2023-02-05 DIAGNOSIS — F419 Anxiety disorder, unspecified: Secondary | ICD-10-CM | POA: Diagnosis not present

## 2023-02-19 DIAGNOSIS — F419 Anxiety disorder, unspecified: Secondary | ICD-10-CM | POA: Diagnosis not present

## 2023-02-26 DIAGNOSIS — F419 Anxiety disorder, unspecified: Secondary | ICD-10-CM | POA: Diagnosis not present

## 2023-03-05 DIAGNOSIS — F419 Anxiety disorder, unspecified: Secondary | ICD-10-CM | POA: Diagnosis not present

## 2023-03-12 DIAGNOSIS — F419 Anxiety disorder, unspecified: Secondary | ICD-10-CM | POA: Diagnosis not present

## 2023-03-19 DIAGNOSIS — F419 Anxiety disorder, unspecified: Secondary | ICD-10-CM | POA: Diagnosis not present

## 2023-04-09 DIAGNOSIS — F419 Anxiety disorder, unspecified: Secondary | ICD-10-CM | POA: Diagnosis not present

## 2023-04-13 ENCOUNTER — Encounter: Payer: Self-pay | Admitting: Internal Medicine

## 2023-04-23 DIAGNOSIS — F419 Anxiety disorder, unspecified: Secondary | ICD-10-CM | POA: Diagnosis not present

## 2023-05-07 DIAGNOSIS — F419 Anxiety disorder, unspecified: Secondary | ICD-10-CM | POA: Diagnosis not present

## 2023-05-21 DIAGNOSIS — F419 Anxiety disorder, unspecified: Secondary | ICD-10-CM | POA: Diagnosis not present

## 2023-06-04 DIAGNOSIS — F419 Anxiety disorder, unspecified: Secondary | ICD-10-CM | POA: Diagnosis not present

## 2023-06-18 DIAGNOSIS — F419 Anxiety disorder, unspecified: Secondary | ICD-10-CM | POA: Diagnosis not present

## 2023-07-02 DIAGNOSIS — F419 Anxiety disorder, unspecified: Secondary | ICD-10-CM | POA: Diagnosis not present

## 2023-07-16 DIAGNOSIS — F419 Anxiety disorder, unspecified: Secondary | ICD-10-CM | POA: Diagnosis not present

## 2023-07-18 ENCOUNTER — Encounter: Payer: Self-pay | Admitting: Family Medicine

## 2023-07-18 ENCOUNTER — Other Ambulatory Visit: Payer: Self-pay | Admitting: Family Medicine

## 2023-07-18 DIAGNOSIS — K5792 Diverticulitis of intestine, part unspecified, without perforation or abscess without bleeding: Secondary | ICD-10-CM

## 2023-07-18 MED ORDER — METRONIDAZOLE 500 MG PO TABS: 500.0000 mg | ORAL_TABLET | Freq: Three times a day (TID) | ORAL | 0 refills | Status: AC

## 2023-07-18 MED ORDER — CEFUROXIME AXETIL 500 MG PO TABS: 500.0000 mg | ORAL_TABLET | Freq: Two times a day (BID) | ORAL | 0 refills | Status: AC

## 2023-07-18 NOTE — Progress Notes (Signed)
   Subjective:    Patient ID: Derrick Garcia, male    DOB: December 10, 1975, 48 y.o.   MRN: [id number]  HPI Documentation for virtual audio and video telecommunications through Caregility encounter:  The patient was located at home. 2 patient identifiers used.  The provider was located in the office. The patient did consent to this visit and is aware of possible charges through their insurance for this visit.  The other persons participating in this telemedicine service were none. Time spent on call was 5 minutes and in review of previous records > total for counseling and coordination of care.  This virtual service is not related to other E/M service within previous 7 days. 25 Enclosed stating that he has a 3-day history of difficulty with left lower quadrant pain but no fever, chills, vomiting or diarrhea.  Does have a previous history of diverticulitis.  He apparently has had a colonoscopy in the past but more recently had a Cologuard which was negative.   Review of Systems     Objective:    Physical Exam Alert and in no distress otherwise not examined       Assessment & Plan:  Diverticulitis.  I will treat with metronidazole  and Ceftin follow-up in office in several days.

## 2023-07-30 DIAGNOSIS — F419 Anxiety disorder, unspecified: Secondary | ICD-10-CM | POA: Diagnosis not present

## 2023-09-10 DIAGNOSIS — F419 Anxiety disorder, unspecified: Secondary | ICD-10-CM | POA: Diagnosis not present

## 2023-09-24 DIAGNOSIS — F419 Anxiety disorder, unspecified: Secondary | ICD-10-CM | POA: Diagnosis not present

## 2023-10-08 DIAGNOSIS — F419 Anxiety disorder, unspecified: Secondary | ICD-10-CM | POA: Diagnosis not present

## 2023-10-13 ENCOUNTER — Encounter: Payer: Self-pay | Admitting: Family Medicine

## 2023-10-13 ENCOUNTER — Ambulatory Visit (INDEPENDENT_AMBULATORY_CARE_PROVIDER_SITE_OTHER): Payer: BC Managed Care – PPO | Admitting: Family Medicine

## 2023-10-13 VITALS — BP 131/84 | HR 72 | Ht 68.0 in | Wt 179.0 lb

## 2023-10-13 DIAGNOSIS — I10 Essential (primary) hypertension: Secondary | ICD-10-CM

## 2023-10-13 DIAGNOSIS — Z0000 Encounter for general adult medical examination without abnormal findings: Secondary | ICD-10-CM

## 2023-10-13 DIAGNOSIS — Z1211 Encounter for screening for malignant neoplasm of colon: Secondary | ICD-10-CM

## 2023-10-13 DIAGNOSIS — J301 Allergic rhinitis due to pollen: Secondary | ICD-10-CM | POA: Diagnosis not present

## 2023-10-13 DIAGNOSIS — J4520 Mild intermittent asthma, uncomplicated: Secondary | ICD-10-CM

## 2023-10-13 DIAGNOSIS — E782 Mixed hyperlipidemia: Secondary | ICD-10-CM

## 2023-10-13 LAB — LIPID PANEL

## 2023-10-13 MED ORDER — ALBUTEROL SULFATE HFA 108 (90 BASE) MCG/ACT IN AERS: 2.0000 | INHALATION_SPRAY | Freq: Four times a day (QID) | RESPIRATORY_TRACT | 0 refills | Status: DC | PRN

## 2023-10-13 MED ORDER — ATENOLOL 25 MG PO TABS: 25.0000 mg | ORAL_TABLET | Freq: Every day | ORAL | 3 refills | Status: AC

## 2023-10-13 MED ORDER — LOSARTAN POTASSIUM 100 MG PO TABS: 100.0000 mg | ORAL_TABLET | Freq: Every day | ORAL | 3 refills | Status: AC

## 2023-10-13 NOTE — Progress Notes (Signed)
 Complete physical exam  Patient: Derrick Garcia   DOB: 1975/04/30   48 y.o. Male  MRN: [id number]  Subjective:    Chief Complaint  Patient presents with   Annual Exam    Fasting annual exam, no new concerns.     Derrick Garcia is a 48 y.o. male who presents today for a complete physical exam.  He reports consuming a smaller portion, less red meat diet. Inconsistent, walks dogs 3 times a week.  He generally feels well. He reports sleeping well. Discussed the use of AI scribe software for clinical note transcription with the patient, who gave verbal consent to proceed.   He has a history of diverticulitis, typically presenting with left-sided dull pain. A previous flare-up was managed with antibiotics, likely Augmentin, taken twice daily. He underwent a colonoscopy over ten years ago and completed a Cologuard test.  He mentions recent weight loss that began unintentionally due to changes in eating habits at work, where he skipped breakfast and lunch, consuming only dinner. He has since adjusted to this pattern and continues to eat only dinner. He has reduced his red meat intake and exercises inconsistently, attending the gym sporadically.  He is currently on atenolol  and losartan  for hypertension, with recent blood pressure readings of 131/84 mmHg. He has a history of asthma, primarily triggered during hay fever season and when doing yard work. He uses an inhaler rarely, less than twice per week.  He does not smoke and drinks alcohol moderately, primarily on weekends, consuming a few bourbons or beers. He is currently undergoing couples therapy biweekly with a therapist and every other month with a preacher, addressing marital issues.      Most recent fall risk assessment:    10/13/2023    8:59 AM  Fall Risk   Falls in the past year? 0  Number falls in past yr: 0  Injury with Fall? 0  Risk for fall due to : No Fall Risks  Follow up Falls evaluation completed     Most recent  depression screenings:    10/13/2023    8:59 AM 09/24/2022    9:33 AM  PHQ 2/9 Scores  PHQ - 2 Score 0 0    Vision:Within last year    Immunization History  Administered Date(s) Administered   Influenza,inj,Quad PF,6+ Mos 11/18/2012   PFIZER(Purple Top)SARS-COV-2 Vaccination 05/12/2019, 06/02/2019   Tdap 08/17/2019    Health Maintenance  Topic Date Due   HIV Screening  Never done   Pneumococcal Vaccine (1 of 2 - PCV) Never done   Hepatitis B Vaccines 19-59 Average Risk (1 of 3 - 19+ 3-dose series) Never done   Fecal DNA (Cologuard)  09/06/2023   COVID-19 Vaccine (3 - 2024-25 season) 10/29/2023 (Originally 10/18/2022)   INFLUENZA VACCINE  05/16/2024 (Originally 09/17/2023)   DTaP/Tdap/Td (2 - Td or Tdap) 08/16/2029   Hepatitis C Screening  Completed   HPV VACCINES  Aged Out   Meningococcal B Vaccine  Aged Out   Colonoscopy  Discontinued    Patient Care Team: Joyce Norleen BROCKS, MD as PCP - General (Family Medicine)  Optho: [facility] GI:Dr Avram Card: Dr. Court Dentist: Dr. Sedalia   Outpatient Medications Prior to Visit  Medication Sig   [DISCONTINUED] albuterol  (VENTOLIN  HFA) 108 (90 Base) MCG/ACT inhaler Inhale 2 puffs into the lungs every 6 (six) hours as needed for wheezing or shortness of breath. Reported on 08/22/2015 (Patient not taking: Reported on 12/17/2022)   [DISCONTINUED] atenolol  (TENORMIN )  25 MG tablet Take 1 tablet (25 mg total) by mouth daily.   [DISCONTINUED] losartan  (COZAAR ) 100 MG tablet Take 1 tablet (100 mg total) by mouth daily.   No facility-administered medications prior to visit.    Review of Systems  All other systems reviewed and are negative.   Family and social history as well as health maintenance and immunizations was reviewed.     Objective:    BP 131/84   Pulse 72   Ht 5' 8 (1.727 m)   Wt 179 lb (81.2 kg)   BMI 27.22 kg/m    Physical Exam  Alert and in no distress. Tympanic membranes and canals are normal. Pharyngeal  area is normal. Neck is supple without adenopathy or thyromegaly. Cardiac exam shows a regular sinus rhythm without murmurs or gallops. Lungs are clear to auscultation. Abdominal exam shows no masses or tenderness with normal bowel sounds     Assessment & Plan:   Assessment and Plan    Essential hypertension Blood pressure controlled with atenolol  and losartan . Recent reading 131/84 mmHg, near target 130/80 mmHg. Weight loss beneficial. - Continue atenolol  and losartan .  Asthma and allergic rhinitis Asthma controlled with rare inhaler use, mainly during allergy season or after yard work. Allergic rhinitis related to hay fever. Inhaler use <2x/week indicates good control. - Recurrent diverticulitis Recurrent diverticulitis previously treated with antibiotics. Colonoscopy showed diverticula, no malignancy. Cologuard appropriate for screening. - Order Cologuard for colon cancer screening.  General Health Maintenance Discussed dietary habits and weight loss. Advised against eating only dinner. Recommended smaller, frequent meals. Encouraged reduced red meat consumption. - Advise small breakfast, lunch, and dinner instead of one large meal. - Encourage continued reduction in red meat consumption.  Marriage counseling Engaged in couples therapy with therapist and preacher. Focus on individual well-being and resolving issues. Therapy ongoing. - Continue biweekly therapy sessions with therapist. - Continue sessions with preacher every other month.       Discussed health benefits of physical activity, and encouraged him to engage in regular exercise appropriate for his age and condition.      Norleen Jobs, MD

## 2023-10-14 ENCOUNTER — Ambulatory Visit: Payer: Self-pay | Admitting: Family Medicine

## 2023-10-14 LAB — CBC WITH DIFFERENTIAL/PLATELET
Basophils Absolute: 0.1 x10E3/uL (ref 0.0–0.2)
Basos: 2 %
EOS (ABSOLUTE): 0.1 x10E3/uL (ref 0.0–0.4)
Eos: 3 %
Hematocrit: 40.9 % (ref 37.5–51.0)
Hemoglobin: 13.7 g/dL (ref 13.0–17.7)
Immature Grans (Abs): 0 x10E3/uL (ref 0.0–0.1)
Immature Granulocytes: 0 %
Lymphocytes Absolute: 1.5 x10E3/uL (ref 0.7–3.1)
Lymphs: 46 %
MCH: 31.6 pg (ref 26.6–33.0)
MCHC: 33.5 g/dL (ref 31.5–35.7)
MCV: 94 fL (ref 79–97)
Monocytes Absolute: 0.3 x10E3/uL (ref 0.1–0.9)
Monocytes: 7 %
Neutrophils Absolute: 1.4 x10E3/uL (ref 1.4–7.0)
Neutrophils: 42 %
Platelets: 191 x10E3/uL (ref 150–450)
RBC: 4.34 x10E6/uL (ref 4.14–5.80)
RDW: 13.3 % (ref 11.6–15.4)
WBC: 3.4 x10E3/uL (ref 3.4–10.8)

## 2023-10-14 LAB — COMPREHENSIVE METABOLIC PANEL WITH GFR
ALT: 15 IU/L (ref 0–44)
AST: 13 IU/L (ref 0–40)
Albumin: 4.3 g/dL (ref 4.1–5.1)
Alkaline Phosphatase: 85 IU/L (ref 44–121)
BUN/Creatinine Ratio: 10 (ref 9–20)
BUN: 14 mg/dL (ref 6–24)
Bilirubin Total: 0.4 mg/dL (ref 0.0–1.2)
CO2: 19 mmol/L — ABNORMAL LOW (ref 20–29)
Calcium: 9.3 mg/dL (ref 8.7–10.2)
Chloride: 103 mmol/L (ref 96–106)
Creatinine, Ser: 1.47 mg/dL — ABNORMAL HIGH (ref 0.76–1.27)
Globulin, Total: 2.8 g/dL (ref 1.5–4.5)
Glucose: 104 mg/dL — ABNORMAL HIGH (ref 70–99)
Potassium: 4 mmol/L (ref 3.5–5.2)
Sodium: 138 mmol/L (ref 134–144)
Total Protein: 7.1 g/dL (ref 6.0–8.5)
eGFR: 58 mL/min/1.73 — ABNORMAL LOW (ref >59)

## 2023-10-14 LAB — LIPID PANEL
Cholesterol, Total: 242 mg/dL — AB (ref 100–199)
HDL: 40 mg/dL (ref >39)
LDL CALC COMMENT:: 6.1 ratio — AB (ref 0.0–5.0)
LDL Chol Calc (NIH): 177 mg/dL — AB (ref 0–99)
Triglycerides: 136 mg/dL (ref 0–149)
VLDL Cholesterol Cal: 25 mg/dL (ref 5–40)

## 2023-10-20 DIAGNOSIS — Z1211 Encounter for screening for malignant neoplasm of colon: Secondary | ICD-10-CM | POA: Diagnosis not present

## 2023-10-22 DIAGNOSIS — F419 Anxiety disorder, unspecified: Secondary | ICD-10-CM | POA: Diagnosis not present

## 2023-10-29 LAB — COLOGUARD: COLOGUARD: NEGATIVE

## 2023-11-01 NOTE — Progress Notes (Signed)
Patient seen results on Mychart.

## 2023-11-05 DIAGNOSIS — F419 Anxiety disorder, unspecified: Secondary | ICD-10-CM | POA: Diagnosis not present

## 2023-12-17 DIAGNOSIS — F419 Anxiety disorder, unspecified: Secondary | ICD-10-CM | POA: Diagnosis not present

## 2023-12-31 DIAGNOSIS — F419 Anxiety disorder, unspecified: Secondary | ICD-10-CM | POA: Diagnosis not present

## 2024-01-28 DIAGNOSIS — F419 Anxiety disorder, unspecified: Secondary | ICD-10-CM | POA: Diagnosis not present

## 2024-02-11 DIAGNOSIS — F419 Anxiety disorder, unspecified: Secondary | ICD-10-CM | POA: Diagnosis not present

## 2024-10-17 ENCOUNTER — Encounter: Payer: Self-pay | Admitting: Family Medicine
